# Patient Record
Sex: Male | Born: 1955 | Race: White | Hispanic: No | Marital: Married | Smoking: Former smoker
Health system: Southern US, Community
[De-identification: ages and names within clinical notes are randomized; demographics above are authoritative.]

## PROBLEM LIST (undated history)

## (undated) DIAGNOSIS — G4730 Sleep apnea, unspecified: Secondary | ICD-10-CM

## (undated) DIAGNOSIS — I10 Essential (primary) hypertension: Secondary | ICD-10-CM

## (undated) HISTORY — PX: OTHER SURGICAL HISTORY: SHX169

## (undated) HISTORY — DX: Sleep apnea, unspecified: G47.30

## (undated) HISTORY — PX: APPENDECTOMY: SHX54

## (undated) HISTORY — DX: Essential (primary) hypertension: I10

---

## 1999-05-09 ENCOUNTER — Ambulatory Visit (HOSPITAL_COMMUNITY): Admission: RE | Admit: 1999-05-09 | Discharge: 1999-05-09 | Payer: Self-pay | Admitting: Orthopedic Surgery

## 1999-05-09 ENCOUNTER — Encounter: Payer: Self-pay | Admitting: Orthopedic Surgery

## 1999-06-02 ENCOUNTER — Encounter (INDEPENDENT_AMBULATORY_CARE_PROVIDER_SITE_OTHER): Payer: Self-pay | Admitting: *Deleted

## 1999-06-02 ENCOUNTER — Ambulatory Visit (HOSPITAL_BASED_OUTPATIENT_CLINIC_OR_DEPARTMENT_OTHER): Admission: RE | Admit: 1999-06-02 | Discharge: 1999-06-02 | Payer: Self-pay | Admitting: Orthopedic Surgery

## 2008-10-05 ENCOUNTER — Encounter: Admission: RE | Admit: 2008-10-05 | Discharge: 2008-10-05 | Payer: Self-pay

## 2008-11-03 ENCOUNTER — Encounter: Admission: RE | Admit: 2008-11-03 | Discharge: 2008-11-03 | Payer: Self-pay

## 2009-11-14 IMAGING — CR DG ORBITS FOR FOREIGN BODY
2 series · 2 of 2 positions shown · non-contrast
Comparison: None.

CLINICAL DATA: Screening or old film for metal prior to MRI.

ORBITS FOR FOREIGN BODY - 2 VIEW

[view not recorded (1 of 2)]
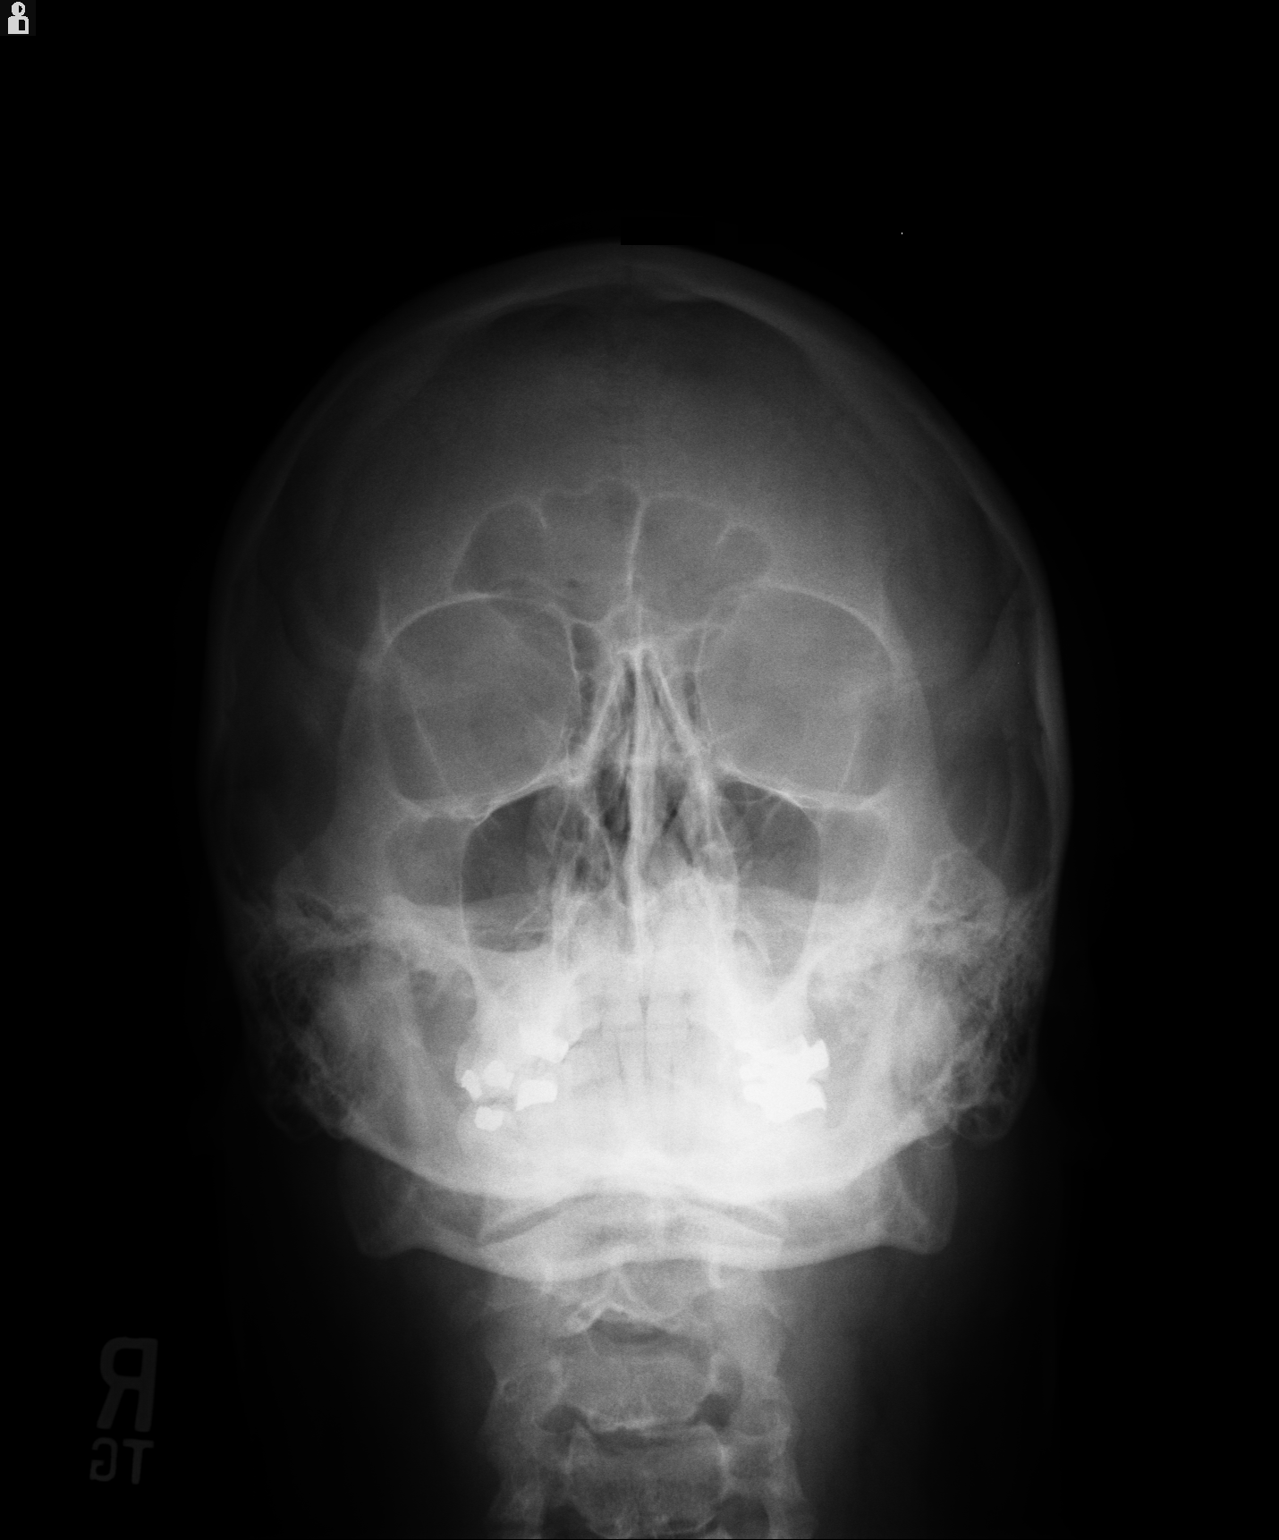

[view not recorded (2 of 2)]
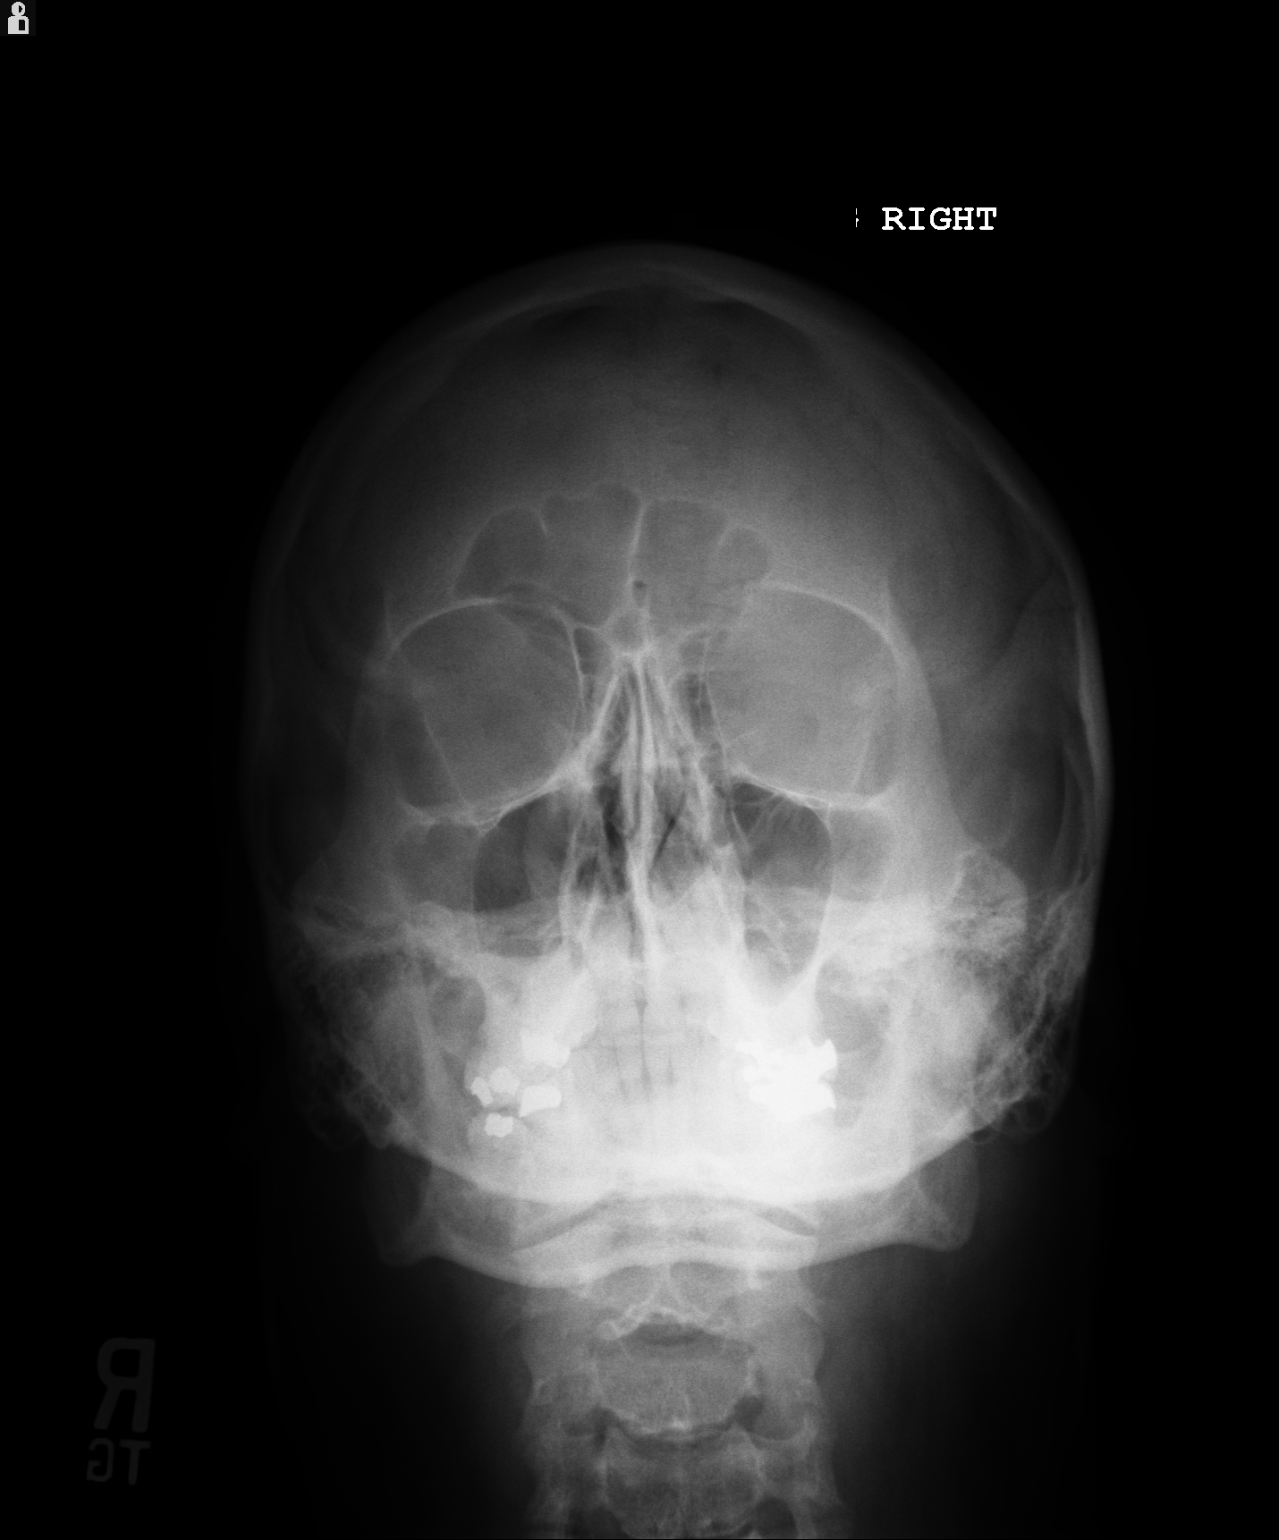

[2 of 2 positions shown; findings below may reference images not displayed]

FINDINGS: No orbital foreign bodies are identified.
IMPRESSION: No metallic orbital foreign bodies.

## 2010-07-29 NOTE — Op Note (Signed)
[place]  Patient:    Gavin Walker, Gavin Walker                         MRN: [id number] Proc. Date: 06/02/99 Adm. Date:  [id number] Attending:  Ronne Binning Dictator:   Nicki Reaper, M.D. CC:         Nicki Reaper, M.D. 2 copies                           Operative Report  PREOPERATIVE DIAGNOSIS:  Tumor, middle phalanx, right index finger.  POSTOPERATIVE DIAGNOSIS:  Tumor, middle phalanx, right index finger.  PROCEDURE:  Excision of mass of right index finger, middle phalanx.  SURGEON:  Dr. Merlyn Lot.  ANESTHESIA:  Axillary block.  ANESTHESIOLOGIST:  Halford Decamp, M.D.  HISTORY OF PRESENT ILLNESS:  The patient is a 55 year old male with a history of a mass enlarging on the middle phalanx at the right index finger.  This has been present for approximately 20 years.  X-rays reveal a bony tumor.  DESCRIPTION OF PROCEDURE:  The patient was brought to the operating room where n axillary block was carried out without difficulty.  Was prepped and draped using Betadine scrubbing solution.  With the right arm free the limb was exsanguinated with a Esmarch bandage.  Tourniquet was placed on the forearm and inflated to 200 mmHg.  A mediolateral incision was made carried down through the subcutaneous tissue.  Mass was immediately encountered.  This was a hard bony tumor with well encapsulation.  The neurovascular structures volarly were protected.  The dorsal sensory nerve was identified, found to be stretched over the tumor.  This was dissected free with blunt and sharp dissection protected.  Osteotomes were then  used to develop an interval between the tumor and normal bone.  This was then osteotomized, and with a moderate amount of difficulty removed due to the hard ony nature of the lesion.  It was removed in total.  X-rays in the AP and lateral oblique direction revealed complete excision of the mass.  The wound was copiously irrigated with  saline.  The skin was closed with interrupted 5-0 Nylon suture. It should be noted that a portion of the A pulley 4 was involved with the mass. This was not able to be reattached with the mass having proceeded to the volar surface. The wound was again irrigated.  A sterile compressive dressing and splint was applied.  The patient tolerated the procedure well, and was taken to the recovery room for observation in satisfactory condition.  He is discharged home.  DISPOSITION:  [facility] in one week.  DISCHARGE MEDICATIONS: 1. Vicodin. 2. Septra DS. DD:  06/02/99 TD:  06/02/99 Job: [id number]

## 2015-06-14 ENCOUNTER — Ambulatory Visit (INDEPENDENT_AMBULATORY_CARE_PROVIDER_SITE_OTHER): Payer: 59 | Admitting: Family Medicine

## 2015-06-14 VITALS — BP 166/98 | HR 81 | Temp 98.0°F | Resp 18 | Ht 70.5 in | Wt 264.0 lb

## 2015-06-14 DIAGNOSIS — H6982 Other specified disorders of Eustachian tube, left ear: Secondary | ICD-10-CM | POA: Diagnosis not present

## 2015-06-14 DIAGNOSIS — R030 Elevated blood-pressure reading, without diagnosis of hypertension: Secondary | ICD-10-CM | POA: Diagnosis not present

## 2015-06-14 DIAGNOSIS — E669 Obesity, unspecified: Secondary | ICD-10-CM

## 2015-06-14 DIAGNOSIS — IMO0001 Reserved for inherently not codable concepts without codable children: Secondary | ICD-10-CM

## 2015-06-14 LAB — GLUCOSE, POCT (MANUAL RESULT ENTRY): POC GLUCOSE: 107 mg/dL — AB (ref 70–99)

## 2015-06-14 MED ORDER — PREDNISONE 20 MG PO TABS
20.0000 mg | ORAL_TABLET | Freq: Every day | ORAL | Status: DC
Start: 2015-06-14 — End: 2017-08-17

## 2015-06-14 MED ORDER — FLUTICASONE PROPIONATE 50 MCG/ACT NA SUSP: NASAL | Status: DC

## 2015-06-14 NOTE — Progress Notes (Signed)
Patient ID: Gavin RosenthalStephen N Walker, male    DOB: 1955/12/09  Age: 60 y.o. MRN: [id number]  Chief Complaint  Patient presents with  . Otalgia    left ear ache x2 weeks; denies having ringing/discharge    Subjective:   Patient is here for the first time in a long time. He is on CPAP for his sleep apnea. He irrigates his nose regularly at nighttime. He blew hard and the left ear stopped up. That is been several weeks ago and it continues to be plugged up and some ringing in it. Has not had that before. He has been working on losing weight, has recently lost about 20 pounds. Does not recall being told he was hypotensive ever. He also has never been told he was diabetic.  Current allergies, medications, problem list, past/family and social histories reviewed.  Objective:  BP 166/98 mmHg  Pulse 81  Temp(Src) 98 F (36.7 C) (Oral)  Resp 18  Ht 5' 10.5" (1.791 m)  Wt 264 lb (119.75 kg)  BMI 37.33 kg/m2  SpO2 98%  Blood pressure elevation is noted. Repeat was 174/86. TMs normal right. Left almost looks like he has a film over it, it is a krinkled appearance to it. Pearly white, noninfected. Throat is clear. Obesity is noted.  Assessment & Plan:   Assessment: 1. Eustachian tube dysfunction, left   2. Elevated blood pressure   3. Obesity       Plan: Will check blood sugar before putting months prednisone. Because of elevated blood pressure typically chemistries also.  Orders Placed This Encounter  Procedures  . Basic metabolic panel  . POCT glucose (manual entry)    Meds ordered this encounter  Medications  . predniSONE (DELTASONE) 20 MG tablet    Sig: Take 1 tablet (20 mg total) by mouth daily with breakfast.    Dispense:  12 tablet    Refill:  0  . fluticasone (FLONASE) 50 MCG/ACT nasal spray    Sig: Use 2 sprays each nostril twice daily for 4 days, then decrease to once daily    Dispense:  16 g    Refill:  1    Results for orders placed or performed in visit on 06/14/15  POCT  glucose (manual entry)  Result Value Ref Range   POC Glucose 107 (A) 70 - 99 mg/dl        Patient Instructions   Monitor your blood pressure to make sure it is running below 140/90. If it continues to be elevated please return  Take the prednisone 20 mg 3 pills daily for 2 days, then 2 daily for 2 days, then 1 daily for 2 days for the eustachian tubes  Use the fluticasone nose spray 2 sprays each nostril twice daily for 4 days, then once daily  If symptoms persist please return for a recheck.  I will let you know the results of your remaining labs, but you sugar was excellent.  IF you received an x-ray today, you will receive an invoice from [facility]. Please contact [facility] at [phone] with questions or concerns regarding your invoice.   IF you received labwork today, you will receive an invoice from [facility]. Please contact [facility] at [phone] with questions or concerns regarding your invoice.   Our billing staff will not be able to assist you with questions regarding bills from these companies.  You will be contacted with the lab results as soon as they are available. The fastest way to get  your results is to activate your My Chart account. Instructions are located on the last page of this paperwork. If you have not heard from [place] regarding the results in 2 weeks, please contact this office.          No Follow-up on file.   Anisten Tomassi, MD 06/14/2015

## 2015-06-14 NOTE — Patient Instructions (Addendum)
Monitor your blood pressure to make sure it is running below 140/90. If it continues to be elevated please return  Take the prednisone 20 mg 3 pills daily for 2 days, then 2 daily for 2 days, then 1 daily for 2 days for the eustachian tubes  Use the fluticasone nose spray 2 sprays each nostril twice daily for 4 days, then once daily  If symptoms persist please return for a recheck.  I will let you know the results of your remaining labs, but you sugar was excellent.  IF you received an x-ray today, you will receive an invoice from [facility]. Please contact [facility] at [phone] with questions or concerns regarding your invoice.   IF you received labwork today, you will receive an invoice from [facility]. Please contact [facility] at [phone] with questions or concerns regarding your invoice.   Our billing staff will not be able to assist you with questions regarding bills from these companies.  You will be contacted with the lab results as soon as they are available. The fastest way to get your results is to activate your My Chart account. Instructions are located on the last page of this paperwork. If you have not heard from us regarding the results in 2 weeks, please contact this office.

## 2015-06-15 LAB — BASIC METABOLIC PANEL
BUN: 15 mg/dL (ref 7–25)
CALCIUM: 9.7 mg/dL (ref 8.6–10.3)
CO2: 29 mmol/L (ref 20–31)
CREATININE: 0.95 mg/dL (ref 0.70–1.33)
Chloride: 104 mmol/L (ref 98–110)
Glucose, Bld: 94 mg/dL (ref 65–99)
POTASSIUM: 5 mmol/L (ref 3.5–5.3)
Sodium: 142 mmol/L (ref 135–146)

## 2016-10-22 DIAGNOSIS — Z23 Encounter for immunization: Secondary | ICD-10-CM | POA: Diagnosis not present

## 2017-08-16 ENCOUNTER — Emergency Department (HOSPITAL_COMMUNITY)
Admission: EM | Admit: 2017-08-16 | Discharge: 2017-08-16 | Disposition: A | Discharge status: Home / Self Care | Payer: 59 | Attending: Emergency Medicine | Admitting: Emergency Medicine

## 2017-08-16 ENCOUNTER — Encounter (HOSPITAL_COMMUNITY): Payer: Self-pay | Admitting: Emergency Medicine

## 2017-08-16 DIAGNOSIS — Z79899 Other long term (current) drug therapy: Secondary | ICD-10-CM | POA: Insufficient documentation

## 2017-08-16 DIAGNOSIS — Z87891 Personal history of nicotine dependence: Secondary | ICD-10-CM | POA: Diagnosis not present

## 2017-08-16 DIAGNOSIS — Z0131 Encounter for examination of blood pressure with abnormal findings: Secondary | ICD-10-CM | POA: Diagnosis not present

## 2017-08-16 DIAGNOSIS — I10 Essential (primary) hypertension: Secondary | ICD-10-CM | POA: Diagnosis present

## 2017-08-16 LAB — CBC WITH DIFFERENTIAL/PLATELET
Basophils Absolute: 0.1 10*3/uL (ref 0.0–0.1)
Basophils Relative: 1 %
EOS PCT: 1 %
Eosinophils Absolute: 0.1 10*3/uL (ref 0.0–0.7)
HEMATOCRIT: 39.4 % (ref 39.0–52.0)
Hemoglobin: 12.9 g/dL — ABNORMAL LOW (ref 13.0–17.0)
Lymphocytes Relative: 27 %
Lymphs Abs: 2.2 10*3/uL (ref 0.7–4.0)
MCH: 20.7 pg — ABNORMAL LOW (ref 26.0–34.0)
MCHC: 32.7 g/dL (ref 30.0–36.0)
MCV: 63.2 fL — AB (ref 78.0–100.0)
MONOS PCT: 8 %
Monocytes Absolute: 0.6 10*3/uL (ref 0.1–1.0)
NEUTROS PCT: 63 %
Neutro Abs: 5.1 10*3/uL (ref 1.7–7.7)
Platelets: 228 10*3/uL (ref 150–400)
RBC: 6.23 MIL/uL — ABNORMAL HIGH (ref 4.22–5.81)
RDW: 17.2 % — ABNORMAL HIGH (ref 11.5–15.5)
WBC: 8.1 10*3/uL (ref 4.0–10.5)

## 2017-08-16 LAB — URINALYSIS, ROUTINE W REFLEX MICROSCOPIC
BILIRUBIN URINE: NEGATIVE
Bacteria, UA: NONE SEEN
GLUCOSE, UA: NEGATIVE mg/dL
Hgb urine dipstick: NEGATIVE
KETONES UR: NEGATIVE mg/dL
LEUKOCYTES UA: NEGATIVE
NITRITE: NEGATIVE
PROTEIN: 100 mg/dL — AB
Specific Gravity, Urine: 1.021 (ref 1.005–1.030)
pH: 6 (ref 5.0–8.0)

## 2017-08-16 LAB — BASIC METABOLIC PANEL
ANION GAP: 7 (ref 5–15)
BUN: 12 mg/dL (ref 6–20)
CO2: 27 mmol/L (ref 22–32)
Calcium: 9.3 mg/dL (ref 8.9–10.3)
Chloride: 106 mmol/L (ref 101–111)
Creatinine, Ser: 0.84 mg/dL (ref 0.61–1.24)
GFR calc Af Amer: >60 mL/min (ref >60)
GLUCOSE: 104 mg/dL — AB (ref 65–99)
POTASSIUM: 4 mmol/L (ref 3.5–5.1)
Sodium: 140 mmol/L (ref 135–145)

## 2017-08-16 NOTE — ED Provider Notes (Signed)
[facility]-EMERGENCY DEPT Provider Note   CSN: [id number] Arrival date & time: 08/16/17  1510     History   Chief Complaint Chief Complaint  Patient presents with  . Hypertension    HPI Gavin Walker is a 62 y.o. male who presents to ED for evaluation of high blood pressure reading.  He was at his dentist office earlier this morning for a routine checkup.  He was told that he had high blood pressure.  He then went to a walk-in clinic at his PCPs office several hours later and was told that he still had high blood pressure with systolics in the 190s.  He was then advised to come to the emergency room immediately.  He has not seen a primary care provider in several years.  States that when he got his blood pressure checked several years ago it was 140s systolic.  He was never started on any antihypertensive medications.  He denies any chest pain, shortness of breath, headache, vision changes, numbness in arms or legs, injuries or falls, history of MI, CVA.  He denies any pain.  HPI  Past Medical History:  Diagnosis Date  . Sleep apnea     There are no active problems to display for this patient.   Past Surgical History:  Procedure Laterality Date  . APPENDECTOMY          Home Medications    Prior to Admission medications   Medication Sig Start Date End Date Taking? Authorizing Provider  B Complex Vitamins (VITAMIN B COMPLEX PO) Take 1 tablet by mouth daily.   Yes [provider]  Cholecalciferol (VITAMIN D3) 2000 units TABS Take 1 tablet by mouth daily.   Yes [provider]  Coenzyme Q10 (CO Q-10 PO) Take 1 tablet by mouth daily.   Yes [provider]  IRON PO Take 1 tablet by mouth daily.   Yes [provider]  Omega-3 Fatty Acids (FISH OIL) 1000 MG CAPS Take 1 capsule by mouth daily.   Yes [provider]  TURMERIC PO Take 1 capsule by mouth daily.   Yes [provider]  fluticasone (FLONASE) 50  MCG/ACT nasal spray Use 2 sprays each nostril twice daily for 4 days, then decrease to once daily Patient not taking: Reported on 08/16/2017 06/14/15   Peyton Najjar, MD  predniSONE (DELTASONE) 20 MG tablet Take 1 tablet (20 mg total) by mouth daily with breakfast. Patient not taking: Reported on 08/16/2017 06/14/15   Peyton Najjar, MD    Family History No family history on file.  Social History Social History   Tobacco Use  . Smoking status: Former Games developer  . Smokeless tobacco: Never Used  Substance Use Topics  . Alcohol use: Yes  . Drug use: No     Allergies   Penicillins and Shellfish allergy   Review of Systems Review of Systems  Constitutional: Negative for appetite change, chills and fever.  HENT: Negative for ear pain, rhinorrhea, sneezing and sore throat.   Eyes: Negative for photophobia and visual disturbance.  Respiratory: Negative for cough, chest tightness, shortness of breath and wheezing.   Cardiovascular: Negative for chest pain and palpitations.  Gastrointestinal: Negative for abdominal pain, blood in stool, constipation, diarrhea, nausea and vomiting.  Genitourinary: Negative for dysuria, hematuria and urgency.  Musculoskeletal: Negative for myalgias.  Skin: Negative for rash.  Neurological: Negative for dizziness, weakness and light-headedness.     Physical Exam Updated Vital Signs BP (!) 184/85  Pulse 63   Temp 98.4 F (36.9 C) (Oral)   Resp 18   Ht 5\' 10"  (1.778 m)   Wt 108.9 kg (240 lb)   SpO2 93%   BMI 34.44 kg/m   Physical Exam  Constitutional: He is oriented to person, place, and time. He appears well-developed and well-nourished. No distress.  HENT:  Head: Normocephalic and atraumatic.  Nose: Nose normal.  Eyes: Pupils are equal, round, and reactive to light. Conjunctivae and EOM are normal. Right eye exhibits no discharge. Left eye exhibits no discharge. No scleral icterus.  Neck: Normal range of motion. Neck supple.  Cardiovascular:  Normal rate, regular rhythm, normal heart sounds and intact distal pulses. Exam reveals no gallop and no friction rub.  No murmur heard. Pulmonary/Chest: Effort normal and breath sounds normal. No respiratory distress.  Abdominal: Soft. Bowel sounds are normal. He exhibits no distension. There is no tenderness. There is no guarding.  Musculoskeletal: Normal range of motion. He exhibits no edema.  Neurological: He is alert and oriented to person, place, and time. No cranial nerve deficit or sensory deficit. He exhibits normal muscle tone. Coordination normal.  Pupils reactive. No facial asymmetry noted. Cranial nerves appear grossly intact. Sensation intact to light touch on face, BUE and BLE. Strength 5/5 in BUE and BLE.   Skin: Skin is warm and dry. No rash noted.  Psychiatric: He has a normal mood and affect.  Nursing note and vitals reviewed.    ED Treatments / Results  Labs (all labs ordered are listed, but only abnormal results are displayed) Labs Reviewed  CBC WITH DIFFERENTIAL/PLATELET - Abnormal; Notable for the following components:      Result Value   RBC 6.23 (*)    Hemoglobin 12.9 (*)    MCV 63.2 (*)    MCH 20.7 (*)    RDW 17.2 (*)    All other components within normal limits  BASIC METABOLIC PANEL - Abnormal; Notable for the following components:   Glucose, Bld 104 (*)    All other components within normal limits  URINALYSIS, ROUTINE W REFLEX MICROSCOPIC - Abnormal; Notable for the following components:   APPearance HAZY (*)    Protein, ur 100 (*)    All other components within normal limits    EKG None  Radiology No results found.  Procedures Procedures (including critical care time)  Medications Ordered in ED Medications - No data to display   Initial Impression / Assessment and Plan / ED Course  I have reviewed the triage vital signs and the nursing notes.  Pertinent labs & imaging results that were available during my care of the patient were  reviewed by me and considered in my medical decision making (see chart for details).     Patient's blood pressure elevated in the emergency department today. Patient denies headache, change in vision, numbness, weakness, chest pain, dyspnea, dizziness, or lightheadedness therefore doubt hypertensive emergency. Screening labs including CBC, BMP, UA unremarkable. Discussed elevated blood pressure with the patient and the need for primary care follow up with potential need to initiate or change antihypertensive medications and or for further evaluation.  Patient is agreeable to plan and is comfortable with discharge home. Discussed return precaution signs/symptoms for hypertensive emergency as listed above with the patient.   Portions of this note were generated with Scientist, clinical (histocompatibility and immunogenetics)Dragon dictation software. Dictation errors may occur despite best attempts at proofreading.   Final Clinical Impressions(s) / ED Diagnoses   Final diagnoses:  Hypertension, unspecified type  ED Discharge Orders    None       Dietrich Pates, PA-C 08/16/17 1951    Jacalyn Lefevre, MD 08/16/17 2002

## 2017-08-16 NOTE — ED Triage Notes (Signed)
Pt reports being at dentist this am and was told has high BP. Pt went to PCP clinic couple hours later and BP was about 190/100something again and was advised to go to ED right away. No HTN hx. Denies n/v, headache or dizziness.

## 2017-08-16 NOTE — Discharge Instructions (Signed)
Please follow-up at the wellness center listed below for further evaluation of your high blood pressure.

## 2017-08-17 ENCOUNTER — Ambulatory Visit (INDEPENDENT_AMBULATORY_CARE_PROVIDER_SITE_OTHER): Payer: 59 | Admitting: Physician Assistant

## 2017-08-17 ENCOUNTER — Encounter: Payer: Self-pay | Admitting: Physician Assistant

## 2017-08-17 ENCOUNTER — Other Ambulatory Visit: Payer: Self-pay

## 2017-08-17 VITALS — BP 184/102 | HR 71 | Temp 99.6°F | Resp 16 | Ht 69.0 in | Wt 238.0 lb

## 2017-08-17 DIAGNOSIS — I10 Essential (primary) hypertension: Secondary | ICD-10-CM

## 2017-08-17 LAB — HEMOGLOBIN A1C
ESTIMATED AVERAGE GLUCOSE: 94 mg/dL
HEMOGLOBIN A1C: 4.9 % (ref 4.8–5.6)

## 2017-08-17 LAB — LIPID PANEL
CHOL/HDL RATIO: 2.5 ratio (ref 0.0–5.0)
CHOLESTEROL TOTAL: 211 mg/dL — AB (ref 100–199)
HDL: 86 mg/dL (ref >39)
LDL Calculated: 104 mg/dL — ABNORMAL HIGH (ref 0–99)
TRIGLYCERIDES: 107 mg/dL (ref 0–149)
VLDL Cholesterol Cal: 21 mg/dL (ref 5–40)

## 2017-08-17 MED ORDER — AMLODIPINE BESYLATE 5 MG PO TABS
5.0000 mg | ORAL_TABLET | Freq: Every day | ORAL | 3 refills | Status: DC
Start: 2017-08-17 — End: 2018-08-12

## 2017-08-17 MED ORDER — CHLORTHALIDONE 25 MG PO TABS: 12.5000 mg | ORAL_TABLET | Freq: Every day | ORAL | 3 refills | Status: DC

## 2017-08-17 NOTE — Progress Notes (Signed)
08/17/2017 11:39 AM   DOB: 02-Jun-1955 / MRN: [id number]  SUBJECTIVE:  Gavin Walker is a 62 y.o. male presenting for elevated BP.  Found at dentist and went to UC and was sent to the ED who advised him to come here after lab work and eval.  Takes ASA 325 4 times a week for primary prevention. Quit smoking 2002 and smoked for about 12 years at 1 pack back.   He is allergic to penicillins and shellfish allergy.   He  has a past medical history of Sleep apnea.    He  reports that he has quit smoking. He quit after 15.00 years of use. He has never used smokeless tobacco. He reports that he drinks about 7.2 oz of alcohol per week. He reports that he does not use drugs. He  has no sexual activity history on file. The patient  has a past surgical history that includes Appendectomy and arm fracture.  His family history is not on file.  Review of Systems  Constitutional: Negative for chills, diaphoresis and fever.  Eyes: Negative.  Negative for blurred vision, double vision and pain.  Respiratory: Negative for cough, hemoptysis, sputum production, shortness of breath and wheezing.   Cardiovascular: Negative for chest pain, orthopnea and leg swelling.  Gastrointestinal: Negative for abdominal pain, blood in stool, constipation, diarrhea, heartburn, melena, nausea and vomiting.  Genitourinary: Negative for dysuria, flank pain, frequency, hematuria and urgency.  Skin: Negative for rash.  Neurological: Negative for dizziness, sensory change, speech change, focal weakness and headaches.    The problem list and medications were reviewed and updated by myself where necessary and exist elsewhere in the encounter.   OBJECTIVE:  BP (!) 184/102   Pulse 71   Temp 99.6 F (37.6 C) (Oral)   Resp 16   Ht 5' 9"  (1.753 m)   Wt 238 lb (108 kg)   SpO2 98%   BMI 35.15 kg/m   BP Readings from Last 3 Encounters:  08/17/17 (!) 184/102  08/16/17 (!) 185/96  06/14/15 (!) 166/98     Physical Exam    Constitutional: He is oriented to person, place, and time. He appears well-developed. He does not appear ill.  Eyes: Pupils are equal, round, and reactive to light. Conjunctivae and EOM are normal.  Cardiovascular: Normal rate, regular rhythm, S1 normal, S2 normal, normal heart sounds, intact distal pulses and normal pulses. Exam reveals no gallop and no friction rub.  No murmur heard. Pulmonary/Chest: Effort normal. No stridor. No respiratory distress. He has no wheezes. He has no rales.  Abdominal: He exhibits no distension.  Musculoskeletal: Normal range of motion. He exhibits no edema.  Neurological: He is alert and oriented to person, place, and time. He has normal strength and normal reflexes. He is not disoriented. No cranial nerve deficit or sensory deficit. He exhibits normal muscle tone. Coordination and gait normal.  Skin: Skin is warm and dry. He is not diaphoretic.  Psychiatric: He has a normal mood and affect. His behavior is normal.  Nursing note and vitals reviewed.   Results for orders placed or performed during the hospital encounter of 08/16/17 (from the past 72 hour(s))  CBC with Differential     Status: Abnormal   Collection Time: 08/16/17  7:03 PM  Result Value Ref Range   WBC 8.1 4.0 - 10.5 K/uL   RBC 6.23 (H) 4.22 - 5.81 MIL/uL   Hemoglobin 12.9 (L) 13.0 - 17.0 g/dL   HCT 39.4 39.0 -  52.0 %   MCV 63.2 (L) 78.0 - 100.0 fL   MCH 20.7 (L) 26.0 - 34.0 pg   MCHC 32.7 30.0 - 36.0 g/dL   RDW 17.2 (H) 11.5 - 15.5 %   Platelets 228 150 - 400 K/uL   Neutrophils Relative % 63 %   Lymphocytes Relative 27 %   Monocytes Relative 8 %   Eosinophils Relative 1 %   Basophils Relative 1 %   Neutro Abs 5.1 1.7 - 7.7 K/uL   Lymphs Abs 2.2 0.7 - 4.0 K/uL   Monocytes Absolute 0.6 0.1 - 1.0 K/uL   Eosinophils Absolute 0.1 0.0 - 0.7 K/uL   Basophils Absolute 0.1 0.0 - 0.1 K/uL   Smear Review MORPHOLOGY UNREMARKABLE     Comment: Performed at [facility], [place] [address] [place], [place] [address]  Basic metabolic panel     Status: Abnormal   Collection Time: 08/16/17  7:03 PM  Result Value Ref Range   Sodium 140 135 - 145 mmol/L   Potassium 4.0 3.5 - 5.1 mmol/L   Chloride 106 101 - 111 mmol/L   CO2 27 22 - 32 mmol/L   Glucose, Bld 104 (H) 65 - 99 mg/dL   BUN 12 6 - 20 mg/dL   Creatinine, Ser 0.84 0.61 - 1.24 mg/dL   Calcium 9.3 8.9 - 10.3 mg/dL   GFR calc non Af Amer >60 >60 mL/min   GFR calc Af Amer >60 >60 mL/min    Comment: (NOTE) The eGFR has been calculated using the CKD EPI equation. This calculation has not been validated in all clinical situations. eGFR's persistently <60 mL/min signify possible Chronic Kidney Disease.    Anion gap 7 5 - 15    Comment: Performed at [facility], [place] [address], [place], [place] [address]  Urinalysis, Routine w reflex microscopic     Status: Abnormal   Collection Time: 08/16/17  7:03 PM  Result Value Ref Range   Color, Urine YELLOW YELLOW   APPearance HAZY (A) CLEAR   Specific Gravity, Urine 1.021 1.005 - 1.030   pH 6.0 5.0 - 8.0   Glucose, UA NEGATIVE NEGATIVE mg/dL   Hgb urine dipstick NEGATIVE NEGATIVE   Bilirubin Urine NEGATIVE NEGATIVE   Ketones, ur NEGATIVE NEGATIVE mg/dL   Protein, ur 100 (A) NEGATIVE mg/dL   Nitrite NEGATIVE NEGATIVE   Leukocytes, UA NEGATIVE NEGATIVE   RBC / HPF 0-5 0 - 5 RBC/hpf   WBC, UA 0-5 0 - 5 WBC/hpf   Bacteria, UA NONE SEEN NONE SEEN   Mucus PRESENT     Comment: Performed at [facility], [place] [address], [place], [place] [address]    No results found.  ASSESSMENT AND PLAN:  Gavin Walker was seen today for hypertension.  Diagnoses and all orders for this visit:  Asymptomatic hypertension -     EKG 12-Lead -     Hemoglobin A1c -     Lipid Panel -     chlorthalidone (HYGROTON) 25 MG tablet; Take 0.5 tablets (12.5 mg total) by mouth daily. -     amLODipine (NORVASC) 5 MG tablet; Take 1 tablet (5 mg total) by  mouth daily.    The patient is advised to call or return to clinic if he does not see an improvement in symptoms, or to seek the care of the closest emergency department if he worsens with the above plan.   Philis Fendt, MHS, PA-C Primary Care at [facility]  [facility] 08/17/2017 11:39 AM

## 2017-08-17 NOTE — Patient Instructions (Addendum)
Start meds today.     IF you received an x-ray today, you will receive an invoice from [facility]. Please contact [facility] at [phone] with questions or concerns regarding your invoice.   IF you received labwork today, you will receive an invoice from [facility]. Please contact [facility] at [phone] with questions or concerns regarding your invoice.   Our billing staff will not be able to assist you with questions regarding bills from these companies.  You will be contacted with the lab results as soon as they are available. The fastest way to get your results is to activate your My Chart account. Instructions are located on the last page of this paperwork. If you have not heard from us regarding the results in 2 weeks, please contact this office.

## 2017-08-20 ENCOUNTER — Encounter: Payer: Self-pay | Admitting: *Deleted

## 2017-09-14 ENCOUNTER — Ambulatory Visit (INDEPENDENT_AMBULATORY_CARE_PROVIDER_SITE_OTHER): Payer: 59 | Admitting: Physician Assistant

## 2017-09-14 ENCOUNTER — Encounter: Payer: Self-pay | Admitting: Physician Assistant

## 2017-09-14 VITALS — BP 161/78 | HR 71 | Temp 98.0°F | Resp 17 | Ht 70.5 in | Wt 252.0 lb

## 2017-09-14 DIAGNOSIS — E7800 Pure hypercholesterolemia, unspecified: Secondary | ICD-10-CM | POA: Diagnosis not present

## 2017-09-14 DIAGNOSIS — E669 Obesity, unspecified: Secondary | ICD-10-CM

## 2017-09-14 DIAGNOSIS — I10 Essential (primary) hypertension: Secondary | ICD-10-CM | POA: Diagnosis not present

## 2017-09-14 MED ORDER — CHLORTHALIDONE 25 MG PO TABS: 25.0000 mg | ORAL_TABLET | Freq: Every day | ORAL | 3 refills | Status: DC

## 2017-09-14 NOTE — Progress Notes (Signed)
09/14/2017 8:35 AM   DOB: [birth year] / MRN: [id number]  SUBJECTIVE:  Gavin Walker Chasen is a 62 y.o. male presenting for follow up of HTN. My last HPI on 6/7 as follows : "Gavin Walker Bence is a 62 y.o. male presenting for elevated BP.  Found at dentist and went to UC and was sent to the ED who advised him to come here after lab work and eval.  Takes ASA 325 4 times a week for primary prevention. Quit smoking 2002 and smoked for about 12 years at 1 pack back.  Today he tells me that he feels well.  Denies any complaints.   He is allergic to penicillins and shellfish allergy.   He  has a past medical history of Sleep apnea.    He  reports that he has quit smoking. He quit after 15.00 years of use. He has never used smokeless tobacco. He reports that he drinks about 7.2 oz of alcohol per week. He reports that he does not use drugs. He  has no sexual activity history on file. The patient  has a past surgical history that includes Appendectomy and arm fracture.  His family history is not on file.  Review of Systems  Constitutional: Negative for chills, diaphoresis and fever.  Eyes: Negative.  Negative for blurred vision, double vision and pain.  Respiratory: Negative for cough, hemoptysis, sputum production, shortness of breath and wheezing.   Cardiovascular: Negative for chest pain, orthopnea and leg swelling.  Gastrointestinal: Negative for abdominal pain, blood in stool, constipation, diarrhea, heartburn, melena, nausea and vomiting.  Genitourinary: Negative for dysuria, flank pain, frequency, hematuria and urgency.  Skin: Negative for rash.  Neurological: Negative for dizziness, sensory change, speech change, focal weakness and headaches.    The problem list and medications were reviewed and updated by myself where necessary and exist elsewhere in the encounter.   OBJECTIVE:  BP (!) 161/78   Pulse 71   Temp 98 F (36.7 C) (Oral)   Resp 17   Ht 5' 10.5" (1.791 m)   Wt 252 lb (114.3  kg)   SpO2 98%   BMI 35.65 kg/m   Wt Readings from Last 3 Encounters:  09/14/17 252 lb (114.3 kg)  08/17/17 238 lb (108 kg)  08/16/17 240 lb (108.9 kg)   Temp Readings from Last 3 Encounters:  09/14/17 98 F (36.7 C) (Oral)  08/17/17 99.6 F (37.6 C) (Oral)  08/16/17 98.4 F (36.9 C) (Oral)   BP Readings from Last 3 Encounters:  09/14/17 (!) 161/78  08/17/17 (!) 184/102  08/16/17 (!) 185/96   Pulse Readings from Last 3 Encounters:  09/14/17 71  08/17/17 71  08/16/17 63    Physical Exam  Constitutional: He is oriented to person, place, and time. He appears well-developed. He does not appear ill.  Eyes: Pupils are equal, round, and reactive to light. Conjunctivae and EOM are normal.  Cardiovascular: Normal rate.  Pulmonary/Chest: Effort normal and breath sounds normal.  Abdominal: Soft. Bowel sounds are normal. He exhibits no distension.  Musculoskeletal: Normal range of motion.  Neurological: He is alert and oriented to person, place, and time. No cranial nerve deficit. Coordination normal.  Skin: Skin is warm and dry. He is not diaphoretic.  Psychiatric: He has a normal mood and affect.  Nursing note and vitals reviewed.   Lab Results  Component Value Date   HGBA1C 4.9 08/17/2017    Lab Results  Component Value Date   WBC 8.1 08/16/2017  HGB 12.9 (L) 08/16/2017   HCT 39.4 08/16/2017   MCV 63.2 (L) 08/16/2017   PLT 228 08/16/2017    Lab Results  Component Value Date   CREATININE 0.84 08/16/2017   BUN 12 08/16/2017   NA 140 08/16/2017   K 4.0 08/16/2017   CL 106 08/16/2017   CO2 27 08/16/2017   Lab Results  Component Value Date   CHOL 211 (H) 08/17/2017   HDL 86 08/17/2017   LDLCALC 104 (H) 08/17/2017   TRIG 107 08/17/2017   CHOLHDL 2.5 08/17/2017   The 10-year ASCVD risk score Denman George DC Jr., et al., 2013) is: 10.6%   Values used to calculate the score:     Age: 34 years     Sex: Male     Is Non-Hispanic African American: No     Diabetic:  No     Tobacco smoker: No     Systolic Blood Pressure: 161 mmHg     Is BP treated: Yes     HDL Cholesterol: 86 mg/dL     Total Cholesterol: 211 mg/dL   ASSESSMENT AND PLAN:  Todd was seen today for follow-up.  Diagnoses and all orders for this visit:  Asymptomatic hypertension Comments: Increase chlrothalidone to 25.  See AVS.  Three months return, sooner if greater than 140/90 amb.  Orders: -     Renal Function Panel -     chlorthalidone (HYGROTON) 25 MG tablet; Take 1 tablet (25 mg total) by mouth daily.  Elevated LDL cholesterol level Comments: Will hold on statin per patient's request.  This is resonable given favorable HDL.   Obesity, Class II, BMI 35-39.9 Comments: Encourage weight loss.    The patient is advised to call or return to clinic if he does not see an improvement in symptoms, or to seek the care of the closest emergency department if he worsens with the above plan.   Deliah Boston, MHS, PA-C Primary Care at [facility] 09/14/2017 8:35 AM

## 2017-09-14 NOTE — Patient Instructions (Addendum)
Take 1 whole tab of chlorthalidone.  Continue to monitor.  Hopefully pressures will stabilize at less than 140/90.  If they don't come back in about 1 month.  Otherwise, come back in three months.   On another note I will be leaving the practice in the end of August 2019 and will be moving back to the [place] area.  It was a pleasure to get to know you and serve you medically.  Please continue to take your medications.  I am happy to help you transition to another provider in the practice.     IF you received an x-ray today, you will receive an invoice from [facility]. Please contact [facility] at [phone] with questions or concerns regarding your invoice.   IF you received labwork today, you will receive an invoice from [facility]. Please contact [facility] at [phone] with questions or concerns regarding your invoice.   Our billing staff will not be able to assist you with questions regarding bills from these companies.  You will be contacted with the lab results as soon as they are available. The fastest way to get your results is to activate your My Chart account. Instructions are located on the last page of this paperwork. If you have not heard from us regarding the results in 2 weeks, please contact this office.

## 2017-09-15 LAB — RENAL FUNCTION PANEL
Albumin: 4.2 g/dL (ref 3.6–4.8)
BUN / CREAT RATIO: 19 (ref 10–24)
BUN: 16 mg/dL (ref 8–27)
CHLORIDE: 104 mmol/L (ref 96–106)
CO2: 24 mmol/L (ref 20–29)
CREATININE: 0.84 mg/dL (ref 0.76–1.27)
Calcium: 9.1 mg/dL (ref 8.6–10.2)
GFR calc non Af Amer: 95 mL/min/{1.73_m2} (ref >59)
GFR, EST AFRICAN AMERICAN: 110 mL/min/{1.73_m2} (ref >59)
GLUCOSE: 98 mg/dL (ref 65–99)
POTASSIUM: 3.9 mmol/L (ref 3.5–5.2)
Phosphorus: 3.1 mg/dL (ref 2.5–4.5)
Sodium: 143 mmol/L (ref 134–144)

## 2017-10-10 ENCOUNTER — Encounter: Payer: Self-pay | Admitting: Physician Assistant

## 2017-11-02 ENCOUNTER — Telehealth: Payer: Self-pay | Admitting: Urgent Care

## 2017-12-14 ENCOUNTER — Ambulatory Visit: Payer: 59 | Admitting: Urgent Care

## 2018-01-03 NOTE — Telephone Encounter (Signed)
done

## 2018-07-16 ENCOUNTER — Other Ambulatory Visit: Payer: Self-pay | Admitting: Physician Assistant

## 2018-07-16 DIAGNOSIS — I10 Essential (primary) hypertension: Secondary | ICD-10-CM

## 2018-08-12 ENCOUNTER — Ambulatory Visit (INDEPENDENT_AMBULATORY_CARE_PROVIDER_SITE_OTHER): Payer: 59 | Admitting: Registered Nurse

## 2018-08-12 ENCOUNTER — Encounter: Payer: Self-pay | Admitting: Registered Nurse

## 2018-08-12 ENCOUNTER — Other Ambulatory Visit: Payer: Self-pay

## 2018-08-12 VITALS — BP 138/78 | HR 99 | Temp 98.1°F | Resp 16 | Ht 71.0 in | Wt 242.0 lb

## 2018-08-12 DIAGNOSIS — Z13220 Encounter for screening for lipoid disorders: Secondary | ICD-10-CM

## 2018-08-12 DIAGNOSIS — Z130 Encounter for screening for diseases of the blood and blood-forming organs and certain disorders involving the immune mechanism: Secondary | ICD-10-CM

## 2018-08-12 DIAGNOSIS — Z1159 Encounter for screening for other viral diseases: Secondary | ICD-10-CM | POA: Diagnosis not present

## 2018-08-12 DIAGNOSIS — Z1329 Encounter for screening for other suspected endocrine disorder: Secondary | ICD-10-CM

## 2018-08-12 DIAGNOSIS — Z7689 Persons encountering health services in other specified circumstances: Secondary | ICD-10-CM | POA: Diagnosis not present

## 2018-08-12 DIAGNOSIS — Z13228 Encounter for screening for other metabolic disorders: Secondary | ICD-10-CM

## 2018-08-12 DIAGNOSIS — I10 Essential (primary) hypertension: Secondary | ICD-10-CM

## 2018-08-12 DIAGNOSIS — Z1211 Encounter for screening for malignant neoplasm of colon: Secondary | ICD-10-CM

## 2018-08-12 LAB — LIPID PANEL

## 2018-08-12 MED ORDER — CHLORTHALIDONE 25 MG PO TABS: 25.0000 mg | ORAL_TABLET | Freq: Every day | ORAL | 3 refills | Status: DC

## 2018-08-12 MED ORDER — AMLODIPINE BESYLATE 5 MG PO TABS: 5.0000 mg | ORAL_TABLET | Freq: Every day | ORAL | 3 refills | Status: DC

## 2018-08-12 NOTE — Progress Notes (Signed)
Established Patient Office Visit  Subjective:  Patient ID: Gavin Walker, male    DOB: 1955-08-12  Age: 63 y.o. MRN: [id number]  CC:  Chief Complaint  Patient presents with  . Transitions Of Care    pt need a new pcp to manage HTN  . Hypertension    medication refill are needed    HPI [name] presents to establish care with myself as PCP and to discuss medication refills  He has been taking amlodipine  PO and Chlorthalidone  PO both daily for his HTN. Today his BP is 138/78. We can continue on this regimen  Pt reports that he has been losing weight with increased activity and a keto diet. We discussed the potential harms from a keto diet - but will monitor with lab work as needed. He has lost 14 lbs in the past month. We are hoping to decrease or eliminate his HTN medications if he reaches his goal weight of 200lbs.   He reports a tick bite in November, 2019. He states that he was able to pull the tick off himself and he has not noticed any systemic sxs. Recently, he noted that he has a small, firm bump with pinkish coloration of the skin at the site of the bite. It has been itchy. He suspects that he left mouthparts of the tick in when he pulled it, but requests an exam to be sure.   Past Medical History:  Diagnosis Date  . Hypertension   . Sleep apnea     Past Surgical History:  Procedure Laterality Date  . APPENDECTOMY    . arm fracture     repair right arm/wrist    Family History  Problem Relation Age of Onset  . Breast cancer Mother   . Liver cancer Father     Social History   Socioeconomic History  . Marital status: Married    Spouse name: Not on file  . Number of children: 3  . Years of education: Not on file  . Highest education level: Not on file  Occupational History  . Occupation: Barista of the [employer]  Social Needs  . Financial resource strain: Not hard at all  . Food insecurity:    Worry: Never true    Inability: Never  true  . Transportation needs:    Medical: No    Non-medical: No  Tobacco Use  . Smoking status: Former Smoker    Packs/day: 1.00    Years: 15.00    Pack years: 15.00    Start date: 1982    Last attempt to quit: 08/11/2000    Years since quitting: 18.0  . Smokeless tobacco: Never Used  Substance and Sexual Activity  . Alcohol use: Yes    Comment: EtOH cessation for weight loss  . Drug use: No  . Sexual activity: Not Currently  Lifestyle  . Physical activity:    Days per week: 7 days    Minutes per session: Not on file  . Stress: Not at all  Relationships  . Social connections:    Talks on phone: Twice a week    Gets together: Once a week    Attends religious service: Never    Active member of club or organization: No    Attends meetings of clubs or organizations: Never    Relationship status: Married  . Intimate partner violence:    Fear of current or ex partner: No    Emotionally abused: No  Physically abused: No    Forced sexual activity: No  Other Topics Concern  . Not on file  Social History Narrative  . Not on file    Outpatient Medications Prior to Visit  Medication Sig Dispense Refill  . B Complex Vitamins (VITAMIN B COMPLEX PO) Take 1 tablet by mouth daily.    . Cholecalciferol (VITAMIN D3) 2000 units TABS Take 1 tablet by mouth daily.    . Coenzyme Q10 (CO Q-10 PO) Take 1 tablet by mouth daily.    . IRON PO Take 1 tablet by mouth daily.    . Omega-3 Fatty Acids (FISH OIL) 1000 MG CAPS Take 1 capsule by mouth daily.    . TURMERIC PO Take 1 capsule by mouth daily.    Marland Kitchen. amLODipine (NORVASC) 5 MG tablet Take 1 tablet (5 mg total) by mouth daily. 90 tablet 3  . chlorthalidone (HYGROTON) 25 MG tablet Take 1 tablet (25 mg total) by mouth daily. 90 tablet 3   No facility-administered medications prior to visit.     Allergies  Allergen Reactions  . Penicillins     Has patient had a PCN reaction causing immediate rash, facial/tongue/throat swelling, SOB or  lightheadedness with hypotension: Y Has patient had a PCN reaction causing severe rash involving mucus membranes or skin necrosis: Y Has patient had a PCN reaction that required hospitalization: N Has patient had a PCN reaction occurring within the last 10 years: N If all of the above answers are "NO", then may proceed with Cephalosporin use.   . Shellfish Allergy     ROS Review of Systems  Constitutional: Negative.   HENT: Negative.   Eyes: Negative.   Respiratory: Negative.   Cardiovascular: Negative.   Gastrointestinal: Negative.   Endocrine: Negative.   Genitourinary: Negative.   Musculoskeletal: Negative.   Skin: Negative.        Per hpi  Allergic/Immunologic: Negative.   Neurological: Negative.   Hematological: Negative.   Psychiatric/Behavioral: Negative.       Objective:    Physical Exam  Constitutional: He is oriented to person, place, and time. He appears well-developed and well-nourished.  HENT:  Head: Normocephalic and atraumatic.  Neck: Normal range of motion.  Cardiovascular: Normal rate, regular rhythm, normal heart sounds and intact distal pulses. Exam reveals no gallop and no friction rub.  No murmur heard. Pulmonary/Chest: Effort normal and breath sounds normal. No respiratory distress. He has no wheezes. He has no rales. He exhibits no tenderness.  Abdominal: Soft. Bowel sounds are normal. He exhibits no distension. There is no guarding.  Musculoskeletal: Normal range of motion.  Neurological: He is alert and oriented to person, place, and time.  Skin: Skin is warm and dry. No rash noted. No cyanosis or erythema. No pallor. Nails show no clubbing.     Psychiatric: He has a normal mood and affect. His behavior is normal. Judgment and thought content normal.    BP 138/78   Pulse 99   Temp 98.1 F (36.7 C) (Oral)   Resp 16   Ht 5\' 11"  (1.803 m)   Wt 242 lb (109.8 kg)   SpO2 98%   BMI 33.75 kg/m  Wt Readings from Last 3 Encounters:  08/12/18  242 lb (109.8 kg)  09/14/17 252 lb (114.3 kg)  08/17/17 238 lb (108 kg)     Health Maintenance Due  Topic Date Due  . Hepatitis C Screening  10/06/1955  . HIV Screening  08/02/1972  . TETANUS/TDAP  08/02/1976  .  COLONOSCOPY  08/03/2007    There are no preventive care reminders to display for this patient.  No results found for: TSH Lab Results  Component Value Date   WBC 8.1 08/16/2017   HGB 12.9 (L) 08/16/2017   HCT 39.4 08/16/2017   MCV 63.2 (L) 08/16/2017   PLT 228 08/16/2017   Lab Results  Component Value Date   NA 143 09/14/2017   K 3.9 09/14/2017   CO2 24 09/14/2017   GLUCOSE 98 09/14/2017   BUN 16 09/14/2017   CREATININE 0.84 09/14/2017   ALBUMIN 4.2 09/14/2017   CALCIUM 9.1 09/14/2017   ANIONGAP 7 08/16/2017   Lab Results  Component Value Date   CHOL 211 (H) 08/17/2017   Lab Results  Component Value Date   HDL 86 08/17/2017   Lab Results  Component Value Date   LDLCALC 104 (H) 08/17/2017   Lab Results  Component Value Date   TRIG 107 08/17/2017   Lab Results  Component Value Date   CHOLHDL 2.5 08/17/2017   Lab Results  Component Value Date   HGBA1C 4.9 08/17/2017      Assessment & Plan:   Problem List Items Addressed This Visit    None    Visit Diagnoses    Encounter to establish care    -  Primary   Screening for endocrine, metabolic and immunity disorder       Relevant Orders   CBC with Differential/Platelet   Comprehensive metabolic panel   TSH   Hemoglobin A1c   Lipid screening       Relevant Orders   Lipid panel   Screening for viral disease       Relevant Orders   HIV antibody (with reflex)   Hepatitis C Antibody   Colon cancer screening       Relevant Orders   Cologuard   Asymptomatic hypertension       Relevant Medications   amLODipine (NORVASC) 5 MG tablet   chlorthalidone (HYGROTON) 25 MG tablet   Asymptomatic hypertension       Increase chlrothalidone to 25.  See AVS.  Three months return, sooner if greater  than 140/90 amb.    Relevant Medications   amLODipine (NORVASC) 5 MG tablet   chlorthalidone (HYGROTON) 25 MG tablet   Essential hypertension       Relevant Medications   amLODipine (NORVASC) 5 MG tablet   chlorthalidone (HYGROTON) 25 MG tablet      Meds ordered this encounter  Medications  . amLODipine (NORVASC) 5 MG tablet    Sig: Take 1 tablet (5 mg total) by mouth daily.    Dispense:  90 tablet    Refill:  3    Order Specific Question:   Supervising Provider    Answer:   Collie Siad A [id number]  . chlorthalidone (HYGROTON) 25 MG tablet    Sig: Take 1 tablet (25 mg total) by mouth daily.    Dispense:  90 tablet    Refill:  3    Order Specific Question:   Supervising Provider    Answer:   Doristine Bosworth [id number]    Follow-up: Return in 6 months (on 02/11/2019). for lipid check, BP check  PLAN:  Labs ordered - routine labs, as well as HIV, HepC. Preventative screening: pt opts for cologuard, understands that this comes with an increased frequency of testing as well as a chance that he will need a screening colonoscopy based on abnormal results.  Plan to continue current HTN  regimen of amlodipine and chlorthalidone. Will recheck BP and lipids in 6 mos - if current weight loss continues, we can discuss the possibility of changing this regimen.  Diet and exercise: discussed risks and benefits to keto diet. Pt has stopped consuming alcohol at this time - a positive change for him.   Patient encouraged to call clinic with any questions, comments, or concerns.    Thank you for your visit with Primary Care at [facility] today.  Janeece Agee, NP [facility] Primary Care at [facility] [address] [place], [place] [address] [phone]

## 2018-08-12 NOTE — Patient Instructions (Addendum)
   If you have lab work done today you will be contacted with your lab results within the next 2 weeks.  If you have not heard from us then please contact us. The fastest way to get your results is to register for My Chart.   IF you received an x-ray today, you will receive an invoice from [facility]. Please contact [facility] at [phone] with questions or concerns regarding your invoice.   IF you received labwork today, you will receive an invoice from [facility]. Please contact [facility] at [phone] with questions or concerns regarding your invoice.   Our billing staff will not be able to assist you with questions regarding bills from these companies.  You will be contacted with the lab results as soon as they are available. The fastest way to get your results is to activate your My Chart account. Instructions are located on the last page of this paperwork. If you have not heard from us regarding the results in 2 weeks, please contact this office.       Hypertension Hypertension, commonly called high blood pressure, is when the force of blood pumping through the arteries is too strong. The arteries are the blood vessels that carry blood from the heart throughout the body. Hypertension forces the heart to work harder to pump blood and may cause arteries to become narrow or stiff. Having untreated or uncontrolled hypertension can cause heart attacks, strokes, kidney disease, and other problems. A blood pressure reading consists of a higher number over a lower number. Ideally, your blood pressure should be below 120/80. The first ("top") number is called the systolic pressure. It is a measure of the pressure in your arteries as your heart beats. The second ("bottom") number is called the diastolic pressure. It is a measure of the pressure in your arteries as the heart relaxes. What are the causes? The cause of this condition is not known. What increases the  risk? Some risk factors for high blood pressure are under your control. Others are not. Factors you can change  Smoking.  Having type 2 diabetes mellitus, high cholesterol, or both.  Not getting enough exercise or physical activity.  Being overweight.  Having too much fat, sugar, calories, or salt (sodium) in your diet.  Drinking too much alcohol. Factors that are difficult or impossible to change  Having chronic kidney disease.  Having a family history of high blood pressure.  Age. Risk increases with age.  Race. You may be at higher risk if you are African-American.  Gender. Men are at higher risk than women before age 45. After age 65, women are at higher risk than men.  Having obstructive sleep apnea.  Stress. What are the signs or symptoms? Extremely high blood pressure (hypertensive crisis) may cause:  Headache.  Anxiety.  Shortness of breath.  Nosebleed.  Nausea and vomiting.  Severe chest pain.  Jerky movements you cannot control (seizures). How is this diagnosed? This condition is diagnosed by measuring your blood pressure while you are seated, with your arm resting on a surface. The cuff of the blood pressure monitor will be placed directly against the skin of your upper arm at the level of your heart. It should be measured at least twice using the same arm. Certain conditions can cause a difference in blood pressure between your right and left arms. Certain factors can cause blood pressure readings to be lower or higher than normal (elevated) for a short period of time:    When your blood pressure is higher when you are in a health care provider's office than when you are at home, this is called white coat hypertension. Most people with this condition do not need medicines.  When your blood pressure is higher at home than when you are in a health care provider's office, this is called masked hypertension. Most people with this condition may need medicines  to control blood pressure. If you have a high blood pressure reading during one visit or you have normal blood pressure with other risk factors:  You may be asked to return on a different day to have your blood pressure checked again.  You may be asked to monitor your blood pressure at home for 1 week or longer. If you are diagnosed with hypertension, you may have other blood or imaging tests to help your health care provider understand your overall risk for other conditions. How is this treated? This condition is treated by making healthy lifestyle changes, such as eating healthy foods, exercising more, and reducing your alcohol intake. Your health care provider may prescribe medicine if lifestyle changes are not enough to get your blood pressure under control, and if:  Your systolic blood pressure is above 130.  Your diastolic blood pressure is above 80. Your personal target blood pressure may vary depending on your medical conditions, your age, and other factors. Follow these instructions at home: Eating and drinking   Eat a diet that is high in fiber and potassium, and low in sodium, added sugar, and fat. An example eating plan is called the DASH (Dietary Approaches to Stop Hypertension) diet. To eat this way: ? Eat plenty of fresh fruits and vegetables. Try to fill half of your plate at each meal with fruits and vegetables. ? Eat whole grains, such as whole wheat pasta, brown rice, or whole grain bread. Fill about one quarter of your plate with whole grains. ? Eat or drink low-fat dairy products, such as skim milk or low-fat yogurt. ? Avoid fatty cuts of meat, processed or cured meats, and poultry with skin. Fill about one quarter of your plate with lean proteins, such as fish, chicken without skin, beans, eggs, and tofu. ? Avoid premade and processed foods. These tend to be higher in sodium, added sugar, and fat.  Reduce your daily sodium intake. Most people with hypertension should  eat less than 1,500 mg of sodium a day.  Limit alcohol intake to no more than 1 drink a day for nonpregnant women and 2 drinks a day for men. One drink equals 12 oz of beer, 5 oz of wine, or 1 oz of hard liquor. Lifestyle   Work with your health care provider to maintain a healthy body weight or to lose weight. Ask what an ideal weight is for you.  Get at least 30 minutes of exercise that causes your heart to beat faster (aerobic exercise) most days of the week. Activities may include walking, swimming, or biking.  Include exercise to strengthen your muscles (resistance exercise), such as pilates or lifting weights, as part of your weekly exercise routine. Try to do these types of exercises for 30 minutes at least 3 days a week.  Do not use any products that contain nicotine or tobacco, such as cigarettes and e-cigarettes. If you need help quitting, ask your health care provider.  Monitor your blood pressure at home as told by your health care provider.  Keep all follow-up visits as told by your health care provider.   This is important. Medicines  Take over-the-counter and prescription medicines only as told by your health care provider. Follow directions carefully. Blood pressure medicines must be taken as prescribed.  Do not skip doses of blood pressure medicine. Doing this puts you at risk for problems and can make the medicine less effective.  Ask your health care provider about side effects or reactions to medicines that you should watch for. Contact a health care provider if:  You think you are having a reaction to a medicine you are taking.  You have headaches that keep coming back (recurring).  You feel dizzy.  You have swelling in your ankles.  You have trouble with your vision. Get help right away if:  You develop a severe headache or confusion.  You have unusual weakness or numbness.  You feel faint.  You have severe pain in your chest or abdomen.  You vomit  repeatedly.  You have trouble breathing. Summary  Hypertension is when the force of blood pumping through your arteries is too strong. If this condition is not controlled, it may put you at risk for serious complications.  Your personal target blood pressure may vary depending on your medical conditions, your age, and other factors. For most people, a normal blood pressure is less than 120/80.  Hypertension is treated with lifestyle changes, medicines, or a combination of both. Lifestyle changes include weight loss, eating a healthy, low-sodium diet, exercising more, and limiting alcohol. This information is not intended to replace advice given to you by your health care provider. Make sure you discuss any questions you have with your health care provider. Document Released: 02/27/2005 Document Revised: 01/26/2016 Document Reviewed: 01/26/2016 Elsevier Interactive Patient Education  2019 Elsevier Inc.  

## 2018-08-13 LAB — HEPATITIS C ANTIBODY: Hep C Virus Ab: <0.1 s/co ratio (ref 0.0–0.9)

## 2018-08-13 LAB — LIPID PANEL
Chol/HDL Ratio: 2.7 ratio (ref 0.0–5.0)
Cholesterol, Total: 203 mg/dL — ABNORMAL HIGH (ref 100–199)
HDL: 75 mg/dL (ref >39)
LDL Calculated: 114 mg/dL — ABNORMAL HIGH (ref 0–99)
Triglycerides: 70 mg/dL (ref 0–149)
VLDL Cholesterol Cal: 14 mg/dL (ref 5–40)

## 2018-08-13 LAB — CBC WITH DIFFERENTIAL/PLATELET
Basophils Absolute: 0.1 10*3/uL (ref 0.0–0.2)
Basos: 1 %
EOS (ABSOLUTE): 0.2 10*3/uL (ref 0.0–0.4)
Eos: 3 %
Hematocrit: 39.4 % (ref 37.5–51.0)
Hemoglobin: 12.6 g/dL — ABNORMAL LOW (ref 13.0–17.7)
Immature Grans (Abs): 0 10*3/uL (ref 0.0–0.1)
Immature Granulocytes: 0 %
Lymphocytes Absolute: 1.5 10*3/uL (ref 0.7–3.1)
Lymphs: 27 %
MCH: 20.6 pg — ABNORMAL LOW (ref 26.6–33.0)
MCHC: 32 g/dL (ref 31.5–35.7)
MCV: 64 fL — ABNORMAL LOW (ref 79–97)
Monocytes Absolute: 0.5 10*3/uL (ref 0.1–0.9)
Monocytes: 9 %
Neutrophils Absolute: 3.4 10*3/uL (ref 1.4–7.0)
Neutrophils: 60 %
Platelets: 268 10*3/uL (ref 150–450)
RBC: 6.12 x10E6/uL — ABNORMAL HIGH (ref 4.14–5.80)
RDW: 18.1 % — ABNORMAL HIGH (ref 11.6–15.4)
WBC: 5.7 10*3/uL (ref 3.4–10.8)

## 2018-08-13 LAB — TSH: TSH: 1.5 u[IU]/mL (ref 0.450–4.500)

## 2018-08-13 LAB — HEMOGLOBIN A1C
Est. average glucose Bld gHb Est-mCnc: 91 mg/dL
Hgb A1c MFr Bld: 4.8 % (ref 4.8–5.6)

## 2018-08-13 LAB — COMPREHENSIVE METABOLIC PANEL
ALT: 69 IU/L — ABNORMAL HIGH (ref 0–44)
AST: 44 IU/L — ABNORMAL HIGH (ref 0–40)
Albumin/Globulin Ratio: 2.6 — ABNORMAL HIGH (ref 1.2–2.2)
Albumin: 4.7 g/dL (ref 3.8–4.8)
Alkaline Phosphatase: 40 IU/L (ref 39–117)
BUN/Creatinine Ratio: 17 (ref 10–24)
BUN: 15 mg/dL (ref 8–27)
Bilirubin Total: 1.5 mg/dL — ABNORMAL HIGH (ref 0.0–1.2)
CO2: 26 mmol/L (ref 20–29)
Calcium: 9.8 mg/dL (ref 8.6–10.2)
Chloride: 100 mmol/L (ref 96–106)
Creatinine, Ser: 0.9 mg/dL (ref 0.76–1.27)
GFR calc Af Amer: 106 mL/min/{1.73_m2} (ref >59)
GFR calc non Af Amer: 92 mL/min/{1.73_m2} (ref >59)
Globulin, Total: 1.8 g/dL (ref 1.5–4.5)
Glucose: 106 mg/dL — ABNORMAL HIGH (ref 65–99)
Potassium: 3.7 mmol/L (ref 3.5–5.2)
Sodium: 140 mmol/L (ref 134–144)
Total Protein: 6.5 g/dL (ref 6.0–8.5)

## 2018-08-13 LAB — HIV ANTIBODY (ROUTINE TESTING W REFLEX): HIV Screen 4th Generation wRfx: NONREACTIVE

## 2018-09-24 ENCOUNTER — Encounter: Payer: Self-pay | Admitting: Registered Nurse

## 2018-09-26 ENCOUNTER — Other Ambulatory Visit: Payer: Self-pay | Admitting: *Deleted

## 2018-09-26 DIAGNOSIS — I10 Essential (primary) hypertension: Secondary | ICD-10-CM

## 2018-09-26 MED ORDER — CHLORTHALIDONE 25 MG PO TABS: 25.0000 mg | ORAL_TABLET | Freq: Every day | ORAL | 3 refills | Status: DC

## 2018-10-03 ENCOUNTER — Other Ambulatory Visit: Payer: Self-pay

## 2018-10-03 DIAGNOSIS — I10 Essential (primary) hypertension: Secondary | ICD-10-CM

## 2018-10-03 MED ORDER — AMLODIPINE BESYLATE 5 MG PO TABS: 5.0000 mg | ORAL_TABLET | Freq: Every day | ORAL | 3 refills | Status: DC

## 2018-11-12 ENCOUNTER — Ambulatory Visit (INDEPENDENT_AMBULATORY_CARE_PROVIDER_SITE_OTHER): Payer: 59 | Admitting: Family Medicine

## 2018-11-12 ENCOUNTER — Other Ambulatory Visit: Payer: Self-pay

## 2018-11-12 ENCOUNTER — Encounter: Payer: Self-pay | Admitting: Family Medicine

## 2018-11-12 VITALS — BP 140/80 | HR 89 | Temp 99.1°F | Resp 14 | Wt 259.4 lb

## 2018-11-12 DIAGNOSIS — Z23 Encounter for immunization: Secondary | ICD-10-CM

## 2018-11-12 DIAGNOSIS — M7989 Other specified soft tissue disorders: Secondary | ICD-10-CM | POA: Diagnosis not present

## 2018-11-12 DIAGNOSIS — R600 Localized edema: Secondary | ICD-10-CM | POA: Diagnosis not present

## 2018-11-12 DIAGNOSIS — M79661 Pain in right lower leg: Secondary | ICD-10-CM

## 2018-11-12 NOTE — Patient Instructions (Addendum)
   This is suspicious for a possible blood clot of the right calf and we are ordering an ultrasound to be done as soon as possible.  Elevate leg to reduce swelling.  Take Tylenol (acetaminophen) 5 mg 2 pills 3 times daily as needed for pain.  [facility] 9 AM Wednesday  (enter to the right of the cancer center)  In the event of worsening, shortness of breath, or chest pain go to the emergency room  If you have lab work done today you will be contacted with your lab results within the next 2 weeks.  If you have not heard from [place] then please contact us. The fastest way to get your results is to register for My Chart.   IF you received an x-ray today, you will receive an invoice from [facility]. Please contact [facility] at [phone] with questions or concerns regarding your invoice.   IF you received labwork today, you will receive an invoice from [facility]. Please contact [facility] at [phone] with questions or concerns regarding your invoice.   Our billing staff will not be able to assist you with questions regarding bills from these companies.  You will be contacted with the lab results as soon as they are available. The fastest way to get your results is to activate your My Chart account. Instructions are located on the last page of this paperwork. If you have not heard from [place] regarding the results in 2 weeks, please contact this office.

## 2018-11-12 NOTE — Progress Notes (Signed)
Patient ID: Gavin RosenthalStephen N Walker, male    DOB: 01-25-1956  Age: 63 y.o. MRN: [id number]  Chief Complaint  Patient presents with  . Leg Pain    leg pain in the right leg/calf area. Pain started sun 8/30 then the swelling started 11/11/18.    Subjective:   63 year old male who has been having pain in his calf since Sunday.  He usually walks 4 miles a day.  He had sat down and stood back up and noticed pain in his calf.  It got worse since then with more swelling.  No discoloration.  No coldness.  Is just swollen from the knee down.  He is tender in the right calf  Current allergies, medications, problem list, past/family and social histories reviewed.  Objective:  BP 140/80 (BP Location: Right Arm, Patient Position: Sitting, Cuff Size: Large)   Pulse 89   Temp 99.1 F (37.3 C) (Oral)   Resp 14   Wt 259 lb 6.4 oz (117.7 kg)   SpO2 97%   BMI 36.18 kg/m   Swelling of leg as noted above.  He swollen in the popliteal fossa little no tenderness or pain in the thigh.  Pain to palpation over the medial calf.  No discoloration, erythema, or warmth.  Positive Homans sign.  Assessment & Plan:   Assessment: 1. Right calf pain   2. Need for prophylactic vaccination and inoculation against influenza   3. Leg swelling   4. Leg edema, right       Plan: Very suspicious for DVT.  Could not get an ultrasound this evening but have it scheduled for 9 AM tomorrow.  Patient understands he needs to go to the ER if at all worse.  Orders Placed This Encounter  Procedures  . Flu Vaccine QUAD 6+ mos PF IM (Fluarix Quad PF)    No orders of the defined types were placed in this encounter.        Patient Instructions     This is suspicious for a possible blood clot of the right calf and we are ordering an ultrasound to be done as soon as possible.  Elevate leg to reduce swelling.  Take Tylenol (acetaminophen) 5 mg 2 pills 3 times daily as needed for pain.  [facility] 9 [facility]  Wednesday  (enter to the right of the cancer center)  In the event of worsening, shortness of breath, or chest pain go to the emergency room  If you have lab work done today you will be contacted with your lab results within the next 2 weeks.  If you have not heard from Gavin Walker then please contact Gavin Walker. The fastest way to get your results is to register for My Chart.   IF you received an x-ray today, you will receive an invoice from [facility]. Please contact [facility] at [phone] with questions or concerns regarding your invoice.   IF you received labwork today, you will receive an invoice from [facility]. Please contact [facility] at [phone] with questions or concerns regarding your invoice.   Our billing staff will not be able to assist you with questions regarding bills from these companies.  You will be contacted with the lab results as soon as they are available. The fastest way to get your results is to activate your My Chart account. Instructions are located on the last page of this paperwork. If you have not heard from Gavin Walker regarding the results in 2 weeks, please contact this office.  Return if symptoms worsen or fail to improve.   Ruben Reason, MD 11/12/2018

## 2018-11-13 ENCOUNTER — Ambulatory Visit (HOSPITAL_COMMUNITY)
Admission: RE | Admit: 2018-11-13 | Discharge: 2018-11-13 | Disposition: A | Discharge status: Home / Self Care | Payer: 59 | Source: Ambulatory Visit | Attending: Family Medicine | Admitting: Family Medicine

## 2018-11-13 DIAGNOSIS — M79661 Pain in right lower leg: Secondary | ICD-10-CM | POA: Diagnosis not present

## 2018-11-13 NOTE — Progress Notes (Signed)
Right lower extremity venous duplex has been completed. Preliminary results can be found in CV Proc through chart review.  Results were given to Dr. Linna Darner.  11/13/18 9:25 AM Gavin Walker RVT

## 2019-01-20 ENCOUNTER — Other Ambulatory Visit: Payer: Self-pay

## 2019-01-20 DIAGNOSIS — Z20822 Contact with and (suspected) exposure to covid-19: Secondary | ICD-10-CM

## 2019-01-21 LAB — NOVEL CORONAVIRUS, NAA: SARS-CoV-2, NAA: NOT DETECTED

## 2019-05-19 ENCOUNTER — Ambulatory Visit: Payer: 59 | Attending: Internal Medicine

## 2019-05-19 DIAGNOSIS — Z23 Encounter for immunization: Secondary | ICD-10-CM

## 2019-05-19 NOTE — Progress Notes (Signed)
   Covid-19 Vaccination Clinic  Name:  TYBERIUS RYNER    MRN: [id number] DOB: Nov 22, 1955  05/19/2019  Mr. Kretsch was observed post Covid-19 immunization for 15 minutes without incident. He was provided with Vaccine Information Sheet and instruction to access the V-Safe system.   Mr. Cambria was instructed to call 911 with any severe reactions post vaccine: Marland Kitchen Difficulty breathing  . Swelling of face and throat  . A fast heartbeat  . A bad rash all over body  . Dizziness and weakness   Immunizations Administered    Name Date Dose VIS Date Route   Pfizer COVID-19 Vaccine 05/19/2019  3:26 PM 0.3 mL 02/21/2019 Intramuscular   Manufacturer: [employer], [employer]   Lot: [id number]   NDC: [address]-8

## 2019-06-18 ENCOUNTER — Ambulatory Visit: Payer: 59 | Attending: Internal Medicine

## 2019-06-18 DIAGNOSIS — Z23 Encounter for immunization: Secondary | ICD-10-CM

## 2019-06-18 NOTE — Progress Notes (Signed)
   Covid-19 Vaccination Clinic  Name:  Gavin Walker    MRN: [id number] DOB: 1956/01/20  06/18/2019  Mr. Vital was observed post Covid-19 immunization for 15 minutes without incident. He was provided with Vaccine Information Sheet and instruction to access the V-Safe system.   [name] was instructed to call 911 with any severe reactions post vaccine: Marland Kitchen Difficulty breathing  . Swelling of face and throat  . A fast heartbeat  . A bad rash all over body  . Dizziness and weakness   Immunizations Administered    Name Date Dose VIS Date Route   Pfizer COVID-19 Vaccine 06/18/2019  2:54 PM 0.3 mL 02/21/2019 Intramuscular   Manufacturer: [employer], [employer]   Lot: [id number]   NDC: [address]-8

## 2019-07-04 ENCOUNTER — Other Ambulatory Visit: Payer: Self-pay | Admitting: Registered Nurse

## 2019-07-04 DIAGNOSIS — I10 Essential (primary) hypertension: Secondary | ICD-10-CM

## 2019-07-27 ENCOUNTER — Other Ambulatory Visit: Payer: Self-pay | Admitting: Registered Nurse

## 2019-07-27 DIAGNOSIS — I10 Essential (primary) hypertension: Secondary | ICD-10-CM

## 2020-06-27 ENCOUNTER — Other Ambulatory Visit: Payer: Self-pay | Admitting: Registered Nurse

## 2020-06-27 DIAGNOSIS — I10 Essential (primary) hypertension: Secondary | ICD-10-CM

## 2020-06-28 NOTE — Telephone Encounter (Signed)
Needs OV before further fills

## 2020-08-10 ENCOUNTER — Other Ambulatory Visit: Payer: Self-pay | Admitting: Registered Nurse

## 2020-08-10 DIAGNOSIS — I10 Essential (primary) hypertension: Secondary | ICD-10-CM

## 2020-09-07 ENCOUNTER — Other Ambulatory Visit: Payer: Self-pay | Admitting: Registered Nurse

## 2020-09-07 DIAGNOSIS — I10 Essential (primary) hypertension: Secondary | ICD-10-CM

## 2023-07-24 ENCOUNTER — Encounter: Payer: Self-pay | Admitting: Family

## 2023-07-24 ENCOUNTER — Ambulatory Visit (INDEPENDENT_AMBULATORY_CARE_PROVIDER_SITE_OTHER): Payer: Self-pay | Admitting: Family

## 2023-07-24 VITALS — BP 160/89 | HR 75 | Temp 98.5°F | Ht 71.0 in | Wt 265.0 lb

## 2023-07-24 DIAGNOSIS — G4733 Obstructive sleep apnea (adult) (pediatric): Secondary | ICD-10-CM | POA: Diagnosis not present

## 2023-07-24 DIAGNOSIS — I10 Essential (primary) hypertension: Secondary | ICD-10-CM

## 2023-07-24 DIAGNOSIS — Z1329 Encounter for screening for other suspected endocrine disorder: Secondary | ICD-10-CM | POA: Diagnosis not present

## 2023-07-24 MED ORDER — AMLODIPINE BESYLATE 10 MG PO TABS: 10.0000 mg | ORAL_TABLET | Freq: Every day | ORAL | 1 refills | Status: DC

## 2023-07-24 MED ORDER — TIRZEPATIDE-WEIGHT MANAGEMENT 7.5 MG/0.5ML ~~LOC~~ SOLN: 7.5000 mg | SUBCUTANEOUS | 3 refills | Status: DC

## 2023-07-24 MED ORDER — HYDROCHLOROTHIAZIDE 50 MG PO TABS
50.0000 mg | ORAL_TABLET | Freq: Every day | ORAL | 1 refills | Status: AC
Start: 2023-07-24 — End: ?

## 2023-07-24 NOTE — Progress Notes (Unsigned)
 Location:      Place of Service:    Provider: Whitt Auletta FNP-C   Ulyess Gammons, NP  Patient Care Team: Ulyess Gammons, NP as PCP - General (Adult Health Nurse Practitioner)  Extended Emergency Contact Information Primary Emergency Contact: Short,Peggy Address: [address]          [place], [place] [address] [place] Mobile Phone: [phone] Relation: Spouse Secondary Emergency Contact: [facility] Phone: [phone] Relation: Friend Preferred language: Andra Banco Interpreter needed? No  Code Status:  *** Goals of care: Advanced Directive information     No data to display           Chief Complaint  Patient presents with   New Patient (Initial Visit)    Discuss getting medication for b/p and discuss weight loss medication pt hasn't tried any meds in past    HPI:  Pt is a 68 y.o. male seen today for medical management of chronic diseases.   Discussed the use of AI scribe software for clinical note transcription with the patient, who gave verbal consent to proceed.  History of Present Illness             Past Medical History:  Diagnosis Date   Hypertension    Sleep apnea    Past Surgical History:  Procedure Laterality Date   APPENDECTOMY     arm fracture     repair right arm/wrist    Allergies  Allergen Reactions   Penicillins     Has patient had a PCN reaction causing immediate rash, facial/tongue/throat swelling, SOB or lightheadedness with hypotension: Y Has patient had a PCN reaction causing severe rash involving mucus membranes or skin necrosis: Y Has patient had a PCN reaction that required hospitalization: N Has patient had a PCN reaction occurring within the last 10 years: N If all of the above answers are "NO", then may proceed with Cephalosporin use.    Shellfish Allergy     Allergies as of 07/24/2023       Reactions   Penicillins    Has patient had a PCN reaction causing immediate rash,  facial/tongue/throat swelling, SOB or lightheadedness with hypotension: Y Has patient had a PCN reaction causing severe rash involving mucus membranes or skin necrosis: Y Has patient had a PCN reaction that required hospitalization: N Has patient had a PCN reaction occurring within the last 10 years: N If all of the above answers are "NO", then may proceed with Cephalosporin use.   Shellfish Allergy         Medication List        Accurate as of Jul 24, 2023  2:47 PM. If you have any questions, ask your nurse or doctor.          STOP taking these medications    chlorthalidone  25 MG tablet Commonly known as: HYGROTON  Stopped by: Cason Dabney C Maripaz Mullan       TAKE these medications    amLODipine  10 MG tablet Commonly known as: NORVASC  What changed: Another medication with the same name was removed. Continue taking this medication, and follow the directions you see here. Changed by: Waldron Gerry C Vonnie Ligman   CO Q-10 PO Take 1 tablet by mouth daily.   Fish Oil 1000 MG Caps Take 1 capsule by mouth daily.   hydrochlorothiazide 50 MG tablet Commonly known as: HYDRODIURIL Take 50 mg by mouth daily. What changed: Another medication with the same name was removed. Continue taking this medication, and follow the directions you see  here. Changed by: Jerimie Mancuso C Ernestene Coover   IRON PO Take 1 tablet by mouth daily.   KRILL OIL PO Take by mouth.   TURMERIC PO Take 1 capsule by mouth daily.   VITAMIN B COMPLEX PO Take 1 tablet by mouth daily.   Vitamin D3 125 MCG (5000 UT) Caps Take 1 capsule by mouth daily.        Review of Systems  Immunization History  Administered Date(s) Administered   Influenza,inj,Quad PF,6+ Mos 11/12/2018   PFIZER(Purple Top)SARS-COV-2 Vaccination 05/19/2019, 06/18/2019   Tdap 10/22/2016   Pertinent  Health Maintenance Due  Topic Date Due   Colonoscopy  07/23/2024 (Originally 08/03/2002)   INFLUENZA VACCINE  10/12/2023      08/17/2017   10:56 AM 09/14/2017     8:17 AM 08/12/2018    8:18 AM 11/12/2018    3:29 PM 07/24/2023    1:49 PM  Fall Risk  Falls in the past year? No No 0 0 0  Was there an injury with Fall?   0 0 0  Fall Risk Category Calculator    0 0  Fall Risk Category (Retired)    Low   (RETIRED) Patient Fall Risk Level   Low fall risk    Patient at Risk for Falls Due to     No Fall Risks  Fall risk Follow up     Falls prevention discussed;Falls evaluation completed   Functional Status Survey:    Vitals:   07/24/23 1349  BP: (!) 160/92  Pulse: 75  Temp: 98.5 F (36.9 C)  TempSrc: Temporal  SpO2: 98%  Weight: 265 lb (120.2 kg)  Height: 5\' 11"  (1.803 m)   Body mass index is 36.96 kg/m. Physical Exam Physical Exam          Labs reviewed: No results for input(s): "NA", "K", "CL", "CO2", "GLUCOSE", "BUN", "CREATININE", "CALCIUM", "MG", "PHOS" in the last 8760 hours. No results for input(s): "AST", "ALT", "ALKPHOS", "BILITOT", "PROT", "ALBUMIN" in the last 8760 hours. No results for input(s): "WBC", "NEUTROABS", "HGB", "HCT", "MCV", "PLT" in the last 8760 hours. Lab Results  Component Value Date   TSH 1.500 08/12/2018   Lab Results  Component Value Date   HGBA1C 4.8 08/12/2018   Lab Results  Component Value Date   CHOL 203 (H) 08/12/2018   HDL 75 08/12/2018   LDLCALC 114 (H) 08/12/2018   TRIG 70 08/12/2018   CHOLHDL 2.7 08/12/2018    Significant Diagnostic Results in last 30 days:  No results found.  Assessment/Plan Assessment and Plan               Family/ staff Communication: Reviewed plan of care with patient  Labs/tests ordered: None   Next Appointment : No follow-ups on file.   Spent 30 minutes of Face to face and non-face to face with patient  >50% time spent counseling; reviewing medical record; tests; labs; documentation and developing future plan of care.   Estil Heman, NP

## 2023-07-25 LAB — COMPLETE METABOLIC PANEL WITHOUT GFR
AG Ratio: 1.7 (calc) (ref 1.0–2.5)
ALT: 83 U/L — ABNORMAL HIGH (ref 9–46)
AST: 56 U/L — ABNORMAL HIGH (ref 10–35)
Albumin: 4.7 g/dL (ref 3.6–5.1)
Alkaline phosphatase (APISO): 48 U/L (ref 35–144)
BUN: 13 mg/dL (ref 7–25)
CO2: 30 mmol/L (ref 20–32)
Calcium: 9.8 mg/dL (ref 8.6–10.3)
Chloride: 99 mmol/L (ref 98–110)
Creat: 0.95 mg/dL (ref 0.70–1.35)
Globulin: 2.7 g/dL (ref 1.9–3.7)
Glucose, Bld: 99 mg/dL (ref 65–99)
Potassium: 4.1 mmol/L (ref 3.5–5.3)
Sodium: 140 mmol/L (ref 135–146)
Total Bilirubin: 2.3 mg/dL — ABNORMAL HIGH (ref 0.2–1.2)
Total Protein: 7.4 g/dL (ref 6.1–8.1)

## 2023-07-25 LAB — LIPID PANEL
Cholesterol: 227 mg/dL — ABNORMAL HIGH (ref <200)
HDL: 77 mg/dL (ref >=40)
LDL Cholesterol (Calc): 120 mg/dL — ABNORMAL HIGH
Non-HDL Cholesterol (Calc): 150 mg/dL — ABNORMAL HIGH (ref <130)
Total CHOL/HDL Ratio: 2.9 (calc) (ref <5.0)
Triglycerides: 178 mg/dL — ABNORMAL HIGH (ref <150)

## 2023-07-25 LAB — TSH: TSH: 1.82 m[IU]/L (ref 0.40–4.50)

## 2023-07-25 LAB — CBC WITH DIFFERENTIAL/PLATELET
Absolute Lymphocytes: 1843 {cells}/uL (ref 850–3900)
Absolute Monocytes: 677 {cells}/uL (ref 200–950)
Basophils Absolute: 58 {cells}/uL (ref 0–200)
Basophils Relative: 0.8 %
Eosinophils Absolute: 94 {cells}/uL (ref 15–500)
Eosinophils Relative: 1.3 %
HCT: 42.1 % (ref 38.5–50.0)
Hemoglobin: 13.1 g/dL — ABNORMAL LOW (ref 13.2–17.1)
MCH: 20.2 pg — ABNORMAL LOW (ref 27.0–33.0)
MCHC: 31.1 g/dL — ABNORMAL LOW (ref 32.0–36.0)
MCV: 64.9 fL — ABNORMAL LOW (ref 80.0–100.0)
MPV: 10.3 fL (ref 7.5–12.5)
Monocytes Relative: 9.4 %
Neutro Abs: 4529 {cells}/uL (ref 1500–7800)
Neutrophils Relative %: 62.9 %
Platelets: 249 10*3/uL (ref 140–400)
RBC: 6.49 10*6/uL — ABNORMAL HIGH (ref 4.20–5.80)
RDW: 18.7 % — ABNORMAL HIGH (ref 11.0–15.0)
Total Lymphocyte: 25.6 %
WBC: 7.2 10*3/uL (ref 3.8–10.8)

## 2023-07-25 LAB — CBC MORPHOLOGY

## 2023-08-13 ENCOUNTER — Other Ambulatory Visit: Payer: Self-pay | Admitting: Family

## 2023-08-13 ENCOUNTER — Ambulatory Visit: Payer: Self-pay | Admitting: Family

## 2023-08-13 DIAGNOSIS — G4733 Obstructive sleep apnea (adult) (pediatric): Secondary | ICD-10-CM

## 2023-08-13 MED ORDER — TIRZEPATIDE 10 MG/0.5ML ~~LOC~~ SOAJ: 10.0000 mg | SUBCUTANEOUS | 3 refills | Status: DC

## 2023-08-13 NOTE — Telephone Encounter (Signed)
 Message routed to [place], NP

## 2023-08-14 ENCOUNTER — Telehealth: Payer: Self-pay | Admitting: *Deleted

## 2023-08-14 DIAGNOSIS — G4733 Obstructive sleep apnea (adult) (pediatric): Secondary | ICD-10-CM

## 2023-08-14 NOTE — Telephone Encounter (Signed)
Requested medication not on active medication list

## 2023-08-14 NOTE — Telephone Encounter (Signed)
 IT sales professional called and wanted to confirm. Stated that patient has been getting Zepbound filled 7.5mg  vial on 07/24/2023.   They received a Rx yesterday for Mounjaro and they do NOT provide this medication at this pharmacy.   Please Clarify.

## 2023-08-14 NOTE — Telephone Encounter (Signed)
 Please notify patient.

## 2023-08-15 NOTE — Telephone Encounter (Signed)
Tried calling patient, LMOM to return call.

## 2023-08-16 ENCOUNTER — Other Ambulatory Visit: Payer: Self-pay | Admitting: Family

## 2023-08-16 MED ORDER — ZEPBOUND 10 MG/0.5ML ~~LOC~~ SOLN: 10.0000 mg | SUBCUTANEOUS | 3 refills | Status: DC

## 2023-08-16 NOTE — Telephone Encounter (Signed)
 Tried calling patient, LMOM to return call.  MyChart message sent to patient.

## 2023-08-16 NOTE — Addendum Note (Signed)
 Addended byChristean Courts C on: 08/16/2023 12:57 PM   Modules accepted: Orders

## 2023-08-16 NOTE — Telephone Encounter (Signed)
 Zepbound 10 mg weekly injection  vial send to [facility] as requested.

## 2023-08-27 ENCOUNTER — Encounter: Payer: Self-pay | Admitting: Family

## 2023-08-27 ENCOUNTER — Ambulatory Visit (INDEPENDENT_AMBULATORY_CARE_PROVIDER_SITE_OTHER): Admitting: Family

## 2023-08-27 VITALS — BP 142/88 | HR 77 | Temp 97.7°F | Resp 20 | Ht 71.0 in | Wt 243.4 lb

## 2023-08-27 DIAGNOSIS — E782 Mixed hyperlipidemia: Secondary | ICD-10-CM

## 2023-08-27 DIAGNOSIS — Z23 Encounter for immunization: Secondary | ICD-10-CM

## 2023-08-27 DIAGNOSIS — I10 Essential (primary) hypertension: Secondary | ICD-10-CM

## 2023-08-27 DIAGNOSIS — G4733 Obstructive sleep apnea (adult) (pediatric): Secondary | ICD-10-CM | POA: Diagnosis not present

## 2023-08-27 MED ORDER — ZEPBOUND 10 MG/0.5ML ~~LOC~~ SOLN: 10.0000 mg | SUBCUTANEOUS | 3 refills | Status: DC

## 2023-08-27 NOTE — Progress Notes (Signed)
 Provider: Christean Courts FNP-C  Gavin Walker, Gavin Guadeloupe, NP  Patient Care Team: Rennae Ferraiolo, Gavin Guadeloupe, NP as PCP - General (Family Medicine)  Extended Emergency Contact Information Primary Emergency Contact: Ollis,Peggy Address: [address]          [place], [place] [address] [place] Mobile Phone: [phone] Relation: Spouse Secondary Emergency Contact: [facility] Phone: [phone] Relation: Friend Preferred language: Andra Banco Interpreter needed? No  Code Status:  Full Code  Goals of care: Advanced Directive information    08/27/2023    2:49 PM  Advanced Directives  Does Patient Have a Medical Advance Directive? No  Would patient like information on creating a medical advance directive? No - Patient declined     Chief Complaint  Patient presents with   Medical Management of Chronic Issues    1 month follow up for weight mangement    Discussed the use of AI scribe software for clinical note transcription with the patient, who gave verbal consent to proceed.  History of Present Illness   Gavin Walker is a 68 year old male who presents for a one-month follow-up for weight management.  He has been using Zepbound  for weight management and recently increased his dose to 10 mg as of yesterday. He experiences mild constipation, which is effectively managed with fiber supplementation. He notes a significant reduction in food intake, estimating he consumes about half of what he used to.  His weight has decreased from 265 pounds to 243.4 pounds since the last visit. He attributes part of his weight loss to reduced alcohol consumption, having only had two glasses of wine since the last visit. He maintains his usual physical activity, walking 8,000 to 10,000 steps daily, a routine he has followed for ten years.  No nausea, vomiting, or palpitations. He feels full faster and has no issues with sleep, maintaining the use of a CPAP machine for sleep apnea,  which he has used for nearly twenty years.  He manages his medication through [employer], paying with his health savings account, and has not encountered any insurance issues.   Past Medical History:  Diagnosis Date   Hypertension    Sleep apnea    Past Surgical History:  Procedure Laterality Date   APPENDECTOMY     arm fracture     repair right arm/wrist    Allergies  Allergen Reactions   Penicillins     Has patient had a PCN reaction causing immediate rash, facial/tongue/throat swelling, SOB or lightheadedness with hypotension: Y Has patient had a PCN reaction causing severe rash involving mucus membranes or skin necrosis: Y Has patient had a PCN reaction that required hospitalization: N Has patient had a PCN reaction occurring within the last 10 years: N If all of the above answers are NO, then may proceed with Cephalosporin use.    Shellfish Allergy     Outpatient Encounter Medications as of 08/27/2023  Medication Sig   amLODipine  (NORVASC ) 10 MG tablet Take 1 tablet (10 mg total) by mouth daily.   B Complex Vitamins (VITAMIN B COMPLEX PO) Take 1 tablet by mouth daily.   Cholecalciferol (VITAMIN D3) 125 MCG (5000 UT) CAPS Take 1 capsule by mouth daily.   Coenzyme Q10 (CO Q-10 PO) Take 1 tablet by mouth daily.   hydrochlorothiazide  (HYDRODIURIL ) 50 MG tablet Take 1 tablet (50 mg total) by mouth daily.   IRON PO Take 1 tablet by mouth daily.   KRILL OIL PO Take by mouth.  Omega-3 Fatty Acids (FISH OIL) 1000 MG CAPS Take 1 capsule by mouth daily.   Zinc 50 MG TABS Take by mouth daily.   [DISCONTINUED] tirzepatide  (ZEPBOUND ) 10 MG/0.5ML injection vial Inject 10 mg into the skin once a week.   tirzepatide  (ZEPBOUND ) 10 MG/0.5ML injection vial Inject 10 mg into the skin once a week.   TURMERIC PO Take 1 capsule by mouth daily. (Patient not taking: Reported on 08/27/2023)   No facility-administered encounter medications on file as of 08/27/2023.    Review of Systems   Constitutional:  Negative for appetite change, chills, fatigue, fever and unexpected weight change.  HENT:  Negative for congestion, ear discharge, ear pain, hearing loss, nosebleeds, postnasal drip, rhinorrhea, sinus pressure, sinus pain, sneezing, sore throat, tinnitus and trouble swallowing.   Eyes:  Negative for pain, discharge, redness, itching and visual disturbance.  Respiratory:  Negative for cough, chest tightness, shortness of breath and wheezing.   Cardiovascular:  Negative for chest pain, palpitations and leg swelling.  Gastrointestinal:  Negative for abdominal distention, abdominal pain, constipation, diarrhea, nausea and vomiting.  Musculoskeletal:  Negative for arthralgias, back pain, gait problem, joint swelling, myalgias, neck pain and neck stiffness.  Skin:  Negative for color change, pallor and rash.  Neurological:  Negative for dizziness, weakness, light-headedness, numbness and headaches.    Immunization History  Administered Date(s) Administered   Influenza,inj,Quad PF,6+ Mos 11/12/2018   PFIZER(Purple Top)SARS-COV-2 Vaccination 05/19/2019, 06/18/2019   PNEUMOCOCCAL CONJUGATE-20 08/27/2023   Tdap 10/22/2016   Pertinent  Health Maintenance Due  Topic Date Due   Colonoscopy  07/23/2024 (Originally 08/03/2002)   INFLUENZA VACCINE  10/12/2023      08/17/2017   10:56 AM 09/14/2017    8:17 AM 08/12/2018    8:18 AM 11/12/2018    3:29 PM 07/24/2023    1:49 PM  Fall Risk  Falls in the past year? No  No  0  0  0  Was there an injury with Fall?   0 0 0  Fall Risk Category Calculator    0 0  Fall Risk Category (Retired)    Low    (RETIRED) Patient Fall Risk Level   Low fall risk     Patient at Risk for Falls Due to     No Fall Risks  Fall risk Follow up     Falls prevention discussed;Falls evaluation completed     Data saved with a previous flowsheet row definition   Functional Status Survey:    Vitals:   08/27/23 1452  BP: (!) 142/88  Pulse: 77  Resp: 20  Temp:  97.7 F (36.5 C)  SpO2: 99%  Weight: 243 lb 6.4 oz (110.4 kg)  Height: 5' 11 (1.803 m)   Body mass index is 33.95 kg/m. Physical Exam VITALS: T- 97.7, P- 77, BP- 160/92 MEASUREMENTS: Weight- 243.4. GENERAL: Alert, cooperative, well developed, no acute distress. HEENT: Normocephalic, normal oropharynx, moist mucous membranes. CHEST: Clear to auscultation bilaterally, no wheezes, rhonchi, or crackles. CARDIOVASCULAR: Normal heart rate and rhythm, S1 and S2 normal without murmurs. ABDOMEN: Soft, non-tender, non-distended, without organomegaly, normal bowel sounds. EXTREMITIES: No cyanosis or edema. NEUROLOGICAL: Cranial nerves grossly intact, moves all extremities without gross motor or sensory deficit.  SKIN: No rash,no lesion or erythema   PSYCHIATRY/BEHAVIORAL: Mood stable   Labs reviewed: Recent Labs    07/24/23 1504  NA 140  K 4.1  CL 99  CO2 30  GLUCOSE 99  BUN 13  CREATININE 0.95  CALCIUM 9.8  Recent Labs    07/24/23 1504  AST 56*  ALT 83*  BILITOT 2.3*  PROT 7.4   Recent Labs    07/24/23 1504  WBC 7.2  NEUTROABS 4,529  HGB 13.1*  HCT 42.1  MCV 64.9*  PLT 249   Lab Results  Component Value Date   TSH 1.82 07/24/2023   Lab Results  Component Value Date   HGBA1C 4.8 08/12/2018   Lab Results  Component Value Date   CHOL 227 (H) 07/24/2023   HDL 77 07/24/2023   LDLCALC 120 (H) 07/24/2023   TRIG 178 (H) 07/24/2023   CHOLHDL 2.9 07/24/2023    Significant Diagnostic Results in last 30 days:  No results found.  Assessment/Plan  Obesity BMI 33.95 with associated comorbidity He is on Zepbound  for weight management, experiencing mild constipation managed with fiber. Weight has decreased from 265 lbs to 243.4 lbs. He consumes about half of his previous intake and feels satiated sooner. He maintains a walking routine of 8,000 to 10,000 steps daily. Weight loss is anticipated to improve cholesterol levels. Medication is obtained via [employer],  paid through his health savings account. - Continue Zepbound  therapy - Monitor for side effects - Maintain current walking routine - Order routine blood work - Schedule follow-up in six months  Hypertension Blood pressure has decreased from 160/92 mmHg, attributed to weight loss and lifestyle modifications. - Monitor blood pressure - Encourage continued weight loss and healthy lifestyle  Sleep Apnea He uses a CPAP machine for sleep apnea, which he has used for nearly 20 years. Weight loss may improve his condition. - Continue CPAP use - Monitor for improvements with weight loss   Family/ staff Communication: Reviewed plan of care with patient verbalized understanding  Labs/tests ordered:  - CBC with Differential/Platelet - CMP with eGFR([facility]) - TSH - Lipid panel  Next Appointment: Return in about 6 months (around 02/26/2024) for medical mangement of chronic issues., Fasting labs in 6 months prior to visit.   Total time:20  minutes. Greater than 50% of total time spent doing patient education regarding morbid obesity, essential hypertension, obstructive sleep apnea on CPAP and health maintenance including symptom/medication management.    Estil Heman, NP

## 2023-09-26 DIAGNOSIS — G4733 Obstructive sleep apnea (adult) (pediatric): Secondary | ICD-10-CM

## 2023-09-27 ENCOUNTER — Other Ambulatory Visit: Payer: Self-pay | Admitting: Nurse Practitioner

## 2023-09-27 MED ORDER — ZEPBOUND 15 MG/0.5ML ~~LOC~~ SOAJ: 15.0000 mg | SUBCUTANEOUS | 5 refills | Status: DC

## 2023-09-27 NOTE — Addendum Note (Signed)
 Addended by: LYNNANN COUGH A on: 09/27/2023 11:55 AM   Modules accepted: Orders

## 2023-09-27 NOTE — Telephone Encounter (Signed)
 Mast, Man X, NP to Me (Selected Message)     09/27/23  9:15 AM Ok to send Zepbound  15mg  Thank you        Rx sent to pharmacy as requested medication list updated.

## 2023-09-28 ENCOUNTER — Other Ambulatory Visit: Payer: Self-pay | Admitting: Adult Health

## 2023-09-28 MED ORDER — TIRZEPATIDE-WEIGHT MANAGEMENT 10 MG/0.5ML ~~LOC~~ SOLN
15.0000 mg | SUBCUTANEOUS | 1 refills | Status: DC
Start: 2023-09-28 — End: 2023-10-04

## 2023-09-28 NOTE — Telephone Encounter (Signed)
 Sent eRx  for Zepbound  vial.

## 2023-10-04 ENCOUNTER — Telehealth: Payer: Self-pay

## 2023-10-04 MED ORDER — TIRZEPATIDE-WEIGHT MANAGEMENT 10 MG/0.5ML ~~LOC~~ SOLN: 10.0000 mg | SUBCUTANEOUS | 1 refills | Status: DC

## 2023-10-04 NOTE — Telephone Encounter (Signed)
 Updated Rx to 10 mg and sent

## 2023-10-04 NOTE — Telephone Encounter (Signed)
 Noted

## 2023-10-04 NOTE — Telephone Encounter (Signed)
 Incoming fax received from [employer] asking that we call [phone].  I called pharmacy and they stated the rx for Zepbound  is incorrect and the instructions need to be updated to inject 10 mg into skin per week versus 15 mg, as the patient can not inject more that the dosage selected.   Monina rx'ed medication for patient and I will send to Harlene who is covering for her

## 2023-10-26 ENCOUNTER — Other Ambulatory Visit: Payer: Self-pay | Admitting: Family

## 2023-10-26 MED ORDER — TIRZEPATIDE 12.5 MG/0.5ML ~~LOC~~ SOAJ: 12.5000 mg | SUBCUTANEOUS | 3 refills | Status: DC

## 2023-10-29 ENCOUNTER — Other Ambulatory Visit: Payer: Self-pay | Admitting: Family

## 2023-10-29 DIAGNOSIS — G4733 Obstructive sleep apnea (adult) (pediatric): Secondary | ICD-10-CM

## 2023-10-29 MED ORDER — TIRZEPATIDE-WEIGHT MANAGEMENT 12.5 MG/0.5ML ~~LOC~~ SOLN
12.5000 mg | SUBCUTANEOUS | 3 refills | Status: DC
Start: 2023-10-29 — End: 2023-10-30

## 2023-10-29 NOTE — Telephone Encounter (Signed)
Requested medication is not on active medication list  

## 2024-01-18 ENCOUNTER — Other Ambulatory Visit: Payer: Self-pay | Admitting: Family

## 2024-01-28 DIAGNOSIS — G4733 Obstructive sleep apnea (adult) (pediatric): Secondary | ICD-10-CM

## 2024-01-28 NOTE — Telephone Encounter (Signed)
Pended Rx and sent to [facility] for approval.  ?

## 2024-01-29 MED ORDER — ZEPBOUND 12.5 MG/0.5ML ~~LOC~~ SOLN: 0.5000 mL | SUBCUTANEOUS | 3 refills | Status: DC

## 2024-02-05 ENCOUNTER — Other Ambulatory Visit: Payer: Self-pay | Admitting: Family

## 2024-02-05 DIAGNOSIS — G4733 Obstructive sleep apnea (adult) (pediatric): Secondary | ICD-10-CM

## 2024-02-06 NOTE — Telephone Encounter (Signed)
 Pharmacy comment: script was written correctly for vials, but the note says ask for pen injector. we do not supply the pen injector-only vials. please clarify if vials are ok to dispense.

## 2024-02-06 NOTE — Telephone Encounter (Signed)
 Okay to substitute for vials

## 2024-02-11 MED ORDER — ZEPBOUND 12.5 MG/0.5ML ~~LOC~~ SOLN: 0.5000 mL | SUBCUTANEOUS | 3 refills | Status: AC

## 2024-02-11 NOTE — Telephone Encounter (Signed)
 New Rx sent to pharmacy

## 2024-02-11 NOTE — Telephone Encounter (Signed)
 Copied from CRM #[id number]. Topic: Clinical - Prescription Issue >> Feb 11, 2024  4:00 PM Fredrica W wrote: Reason for CRM: Vernell from [employer] called about ZEPBOUND  12.5 MG/0.5ML SOLN. States Rx invalid - sent over for auto injectors - only sell vials at pharmacy. Need to know if vials are ok or if patient wants to fill at another pharmacy. Thank You   Phone: [phone] 7am-8:30pm

## 2024-02-21 ENCOUNTER — Other Ambulatory Visit: Payer: Self-pay | Admitting: Family

## 2024-02-22 ENCOUNTER — Other Ambulatory Visit: Payer: Self-pay

## 2024-02-22 DIAGNOSIS — E782 Mixed hyperlipidemia: Secondary | ICD-10-CM

## 2024-02-22 DIAGNOSIS — I10 Essential (primary) hypertension: Secondary | ICD-10-CM

## 2024-02-25 ENCOUNTER — Ambulatory Visit: Payer: Self-pay | Admitting: Family

## 2024-02-25 ENCOUNTER — Encounter: Payer: Self-pay | Admitting: Family

## 2024-02-25 VITALS — BP 140/80 | HR 77 | Temp 98.1°F | Resp 20 | Ht 71.0 in | Wt 216.8 lb

## 2024-02-25 DIAGNOSIS — G4733 Obstructive sleep apnea (adult) (pediatric): Secondary | ICD-10-CM | POA: Diagnosis not present

## 2024-02-25 DIAGNOSIS — E782 Mixed hyperlipidemia: Secondary | ICD-10-CM | POA: Diagnosis not present

## 2024-02-25 DIAGNOSIS — I10 Essential (primary) hypertension: Secondary | ICD-10-CM

## 2024-02-25 DIAGNOSIS — R17 Unspecified jaundice: Secondary | ICD-10-CM

## 2024-02-25 DIAGNOSIS — Z5320 Procedure and treatment not carried out because of patient's decision for unspecified reasons: Secondary | ICD-10-CM | POA: Insufficient documentation

## 2024-02-25 DIAGNOSIS — D509 Iron deficiency anemia, unspecified: Secondary | ICD-10-CM

## 2024-02-25 MED ORDER — HYDROCHLOROTHIAZIDE 50 MG PO TABS: 50.0000 mg | ORAL_TABLET | Freq: Every day | ORAL | 1 refills | Status: AC

## 2024-02-25 MED ORDER — IRON 325 (65 FE) MG PO TABS: 325.0000 mg | ORAL_TABLET | Freq: Two times a day (BID) | ORAL | Status: AC

## 2024-02-25 NOTE — Progress Notes (Signed)
 Provider: Roxan Plough FNP-C   [name], [name], NP  Patient Care Team: Estelene Carmack, [name], NP as PCP - General (Family Medicine)  Extended Emergency Contact Information Primary Emergency Contact: Dorer,Peggy Address: [address]          [place], [place] [address] [place] Mobile Phone: [phone] Relation: Spouse Secondary Emergency Contact: [facility] Phone: [phone] Relation: Friend Preferred language: Isadora Interpreter needed? No  Code Status:  Full Code  Goals of care: Advanced Directive information    02/25/2024    2:48 PM  Advanced Directives  Does Patient Have a Medical Advance Directive? No  Would patient like information on creating a medical advance directive? No - Patient declined     Chief Complaint  Patient presents with   Medical Management of Chronic Issues    6 Month follow up.    HPI:  Pt is a 68 y.o. male seen today for medical management of chronic diseases. He denies any acute issues this visit.  Recent lab results reviewed and discussed during visit.   Hypertension - on amlodipine  and Hydrochlorothiazide .denies any headache,dizziness,vision changes,fatigue,chest tightness,palpitation,chest pain or shortness of breath.     Obstructive sleep Apnea -wears a CPAP at night.  Currently on Zepbound  12.5 mg for weight management.  Weight down to 216.8 pounds from 243 pounds on last visit 6 months ago.  Tolerating Zepbound  well without any side effects.  Hyperlipidemia -TC 173 previous 227,TRG 63 previous was 178,LDL 97 previous was 120.  Has made dietary changes and losing weight on Zepbound  has helped.  He does exercise by weightlifting 3 times a week rowing and walking 3 times per week for 10,000 steps each day.  Iron  deficiency anemia -recent hemoglobin down to 12.5 with low indices previous was 13.1  and 12.6.  Has had low hemoglobin for several years since he was young. He has not been seen by hematologist.  He  declined any black stool or blood in the stool.  He has declined colonoscopy and Cologuard.  Elevated serum bilirubin - recent bilirubin higher than previous 2.9 previous was 2.3 and 1.5 he denies any right upper quadrant pain,nausea /vomiting or shoulder/back pain.   Past Medical History:  Diagnosis Date   Hypertension    Sleep apnea    Past Surgical History:  Procedure Laterality Date   APPENDECTOMY     arm fracture     repair right arm/wrist    Allergies[1]  Allergies as of 02/25/2024       Reactions   Penicillins    Has patient had a PCN reaction causing immediate rash, facial/tongue/throat swelling, SOB or lightheadedness with hypotension: Y Has patient had a PCN reaction causing severe rash involving mucus membranes or skin necrosis: Y Has patient had a PCN reaction that required hospitalization: N Has patient had a PCN reaction occurring within the last 10 years: N If all of the above answers are NO, then may proceed with Cephalosporin use.   Shellfish Allergy         Medication List        Accurate as of February 25, 2024  3:07 PM. If you have any questions, ask your nurse or doctor.          amLODipine  10 MG tablet Commonly known as: NORVASC  TAKE 1 TABLET BY MOUTH EVERY DAY   CO Q-10 PO Take 1 tablet by mouth daily.   Fish Oil 1000 MG Caps Take 1 capsule by mouth daily.   hydrochlorothiazide  50  MG tablet Commonly known as: HYDRODIURIL  TAKE 1 TABLET BY MOUTH EVERY DAY   IRON  PO Take 1 tablet by mouth daily.   KRILL OIL PO Take by mouth.   TURMERIC PO Take 1 capsule by mouth daily.   VITAMIN B COMPLEX PO Take 1 tablet by mouth daily.   Vitamin D3 125 MCG (5000 UT) Caps Take 1 capsule by mouth daily.   Zepbound  12.5 MG/0.5ML Soln Generic drug: Tirzepatide -Weight Management Inject 0.5 mLs into the skin once a week.   Zinc 50 MG Tabs Take by mouth daily.        Review of Systems  Constitutional:  Negative for appetite change,  chills, fatigue, fever and unexpected weight change.  HENT:  Negative for congestion, dental problem, ear discharge, ear pain, facial swelling, hearing loss, nosebleeds, postnasal drip, rhinorrhea, sinus pressure, sinus pain, sneezing, sore throat, tinnitus and trouble swallowing.   Eyes:  Negative for pain, discharge, redness, itching and visual disturbance.  Respiratory:  Negative for cough, chest tightness, shortness of breath and wheezing.   Cardiovascular:  Negative for chest pain, palpitations and leg swelling.  Gastrointestinal:  Negative for abdominal distention, abdominal pain, blood in stool, constipation, diarrhea, nausea and vomiting.  Endocrine: Negative for cold intolerance, heat intolerance, polydipsia, polyphagia and polyuria.  Genitourinary:  Negative for difficulty urinating, dysuria, flank pain, frequency and urgency.  Musculoskeletal:  Negative for arthralgias, back pain, gait problem, joint swelling, myalgias, neck pain and neck stiffness.  Skin:  Negative for color change, pallor, rash and wound.  Neurological:  Negative for dizziness, syncope, speech difficulty, weakness, light-headedness, numbness and headaches.  Hematological:  Does not bruise/bleed easily.  Psychiatric/Behavioral:  Negative for agitation, behavioral problems, confusion, hallucinations, self-injury, sleep disturbance and suicidal ideas. The patient is not nervous/anxious.     Immunization History  Administered Date(s) Administered   Influenza,inj,Quad PF,6+ Mos 11/12/2018   PFIZER(Purple Top)SARS-COV-2 Vaccination 05/19/2019, 06/18/2019   PNEUMOCOCCAL CONJUGATE-20 08/27/2023   Tdap 10/22/2016   Zoster Recombinant(Shingrix) 01/11/2024   Pertinent  Health Maintenance Due  Topic Date Due   Influenza Vaccine  06/10/2024 (Originally 10/12/2023)   Colonoscopy  07/23/2024 (Originally 08/03/2002)      09/14/2017    8:17 AM 08/12/2018    8:18 AM 11/12/2018    3:29 PM 07/24/2023    1:49 PM 02/25/2024    2:48 PM   Fall Risk  Falls in the past year? No  0  0  0 0  Was there an injury with Fall?  0  0  0  0  Fall Risk Category Calculator   0 0 0  Fall Risk Category (Retired)   Low     (RETIRED) Patient Fall Risk Level  Low fall risk      Patient at Risk for Falls Due to    No Fall Risks No Fall Risks  Fall risk Follow up    Falls prevention discussed;Falls evaluation completed Falls evaluation completed     Data saved with a previous flowsheet row definition   Functional Status Survey:    Vitals:   02/25/24 1451  BP: (!) 140/80  Pulse: 77  Resp: 20  Temp: 98.1 F (36.7 C)  SpO2: 98%  Weight: 216 lb 12.8 oz (98.3 kg)  Height: 5' 11 (1.803 m)   Body mass index is 30.24 kg/m. Physical Exam Vitals reviewed.  Constitutional:      General: He is not in acute distress.    Appearance: Normal appearance. He is obese. He is not ill-appearing  or diaphoretic.  HENT:     Head: Normocephalic.     Right Ear: Tympanic membrane, ear canal and external ear normal. There is no impacted cerumen.     Left Ear: Tympanic membrane, ear canal and external ear normal. There is no impacted cerumen.     Nose: Nose normal. No congestion or rhinorrhea.     Mouth/Throat:     Mouth: Mucous membranes are moist.     Pharynx: Oropharynx is clear. No oropharyngeal exudate or posterior oropharyngeal erythema.  Eyes:     General: No scleral icterus.       Right eye: No discharge.        Left eye: No discharge.     Extraocular Movements: Extraocular movements intact.     Conjunctiva/sclera: Conjunctivae normal.     Pupils: Pupils are equal, round, and reactive to light.  Neck:     Vascular: No carotid bruit.  Cardiovascular:     Rate and Rhythm: Normal rate and regular rhythm.     Pulses: Normal pulses.     Heart sounds: Normal heart sounds. No murmur heard.    No friction rub. No gallop.  Pulmonary:     Effort: Pulmonary effort is normal. No respiratory distress.     Breath sounds: Normal breath sounds. No  wheezing, rhonchi or rales.  Chest:     Chest wall: No tenderness.  Abdominal:     General: Bowel sounds are normal. There is no distension.     Palpations: Abdomen is soft. There is no mass.     Tenderness: There is no abdominal tenderness. There is no right CVA tenderness, left CVA tenderness, guarding or rebound.  Musculoskeletal:        General: No swelling or tenderness. Normal range of motion.     Cervical back: Normal range of motion. No rigidity or tenderness.     Right lower leg: No edema.     Left lower leg: No edema.  Lymphadenopathy:     Cervical: No cervical adenopathy.  Skin:    General: Skin is warm and dry.     Coloration: Skin is not pale.     Findings: No bruising, erythema, lesion or rash.  Neurological:     Mental Status: He is alert and oriented to person, place, and time.     Cranial Nerves: No cranial nerve deficit.     Sensory: No sensory deficit.     Motor: No weakness.     Coordination: Coordination normal.     Gait: Gait normal.  Psychiatric:        Mood and Affect: Mood normal.        Speech: Speech normal.        Behavior: Behavior normal.        Thought Content: Thought content normal.        Judgment: Judgment normal.    Labs reviewed: Recent Labs    07/24/23 1504 02/22/24 0814  NA 140 139  K 4.1 4.4  CL 99 102  CO2 30 31  GLUCOSE 99 88  BUN 13 19  CREATININE 0.95 0.91  CALCIUM 9.8 9.9   Recent Labs    07/24/23 1504 02/22/24 0814  AST 56* 15  ALT 83* 14  BILITOT 2.3* 2.9*  PROT 7.4 7.0   Recent Labs    07/24/23 1504 02/22/24 0814  WBC 7.2 7.3  NEUTROABS 4,529 3,811  HGB 13.1* 12.5*  HCT 42.1 41.1  MCV 64.9* 65.0*  PLT 249 273  Lab Results  Component Value Date   TSH 2.20 02/22/2024   Lab Results  Component Value Date   HGBA1C 4.8 08/12/2018   Lab Results  Component Value Date   CHOL 173 02/22/2024   HDL 61 02/22/2024   LDLCALC 97 02/22/2024   TRIG 63 02/22/2024   CHOLHDL 2.8 02/22/2024    Significant  Diagnostic Results in last 30 days:  No results found.  Assessment/Plan  1. Essential hypertension (Primary) - Blood pressure stable -Continue on amlodipine  and hydrochlorothiazide  -Continue dietary modification and exercise - TSH; Future - CBC with Differential/Platelet; Future - CMP; Future  2. Mixed hyperlipidemia LDL at goal -Continue dietary modification and exercise at least 30 minutes 3 times per week  - Lipid panel; Future  3. Obstructive sleep apnea -Continue CPAP at bedtime -Continue with lifestyle modification -Continue Zepbound  12.5 mg injection weekly  - CBC with Differential/Platelet; Future - CMP; Future  4. Morbid obesity (HCC) - Body mass index is 30.24 kg/m with associated comorbidity hypertension and obstructive sleep apnea - Continue with dietary modification and exercise as above  5. Iron  deficiency anemia, unspecified iron  deficiency anemia type Hemoglobin 12.5 previous 13.1 no signs of bleeding reported.will add TIBC and Ferritin panel to current lab work to rule out iron  deficiency anemia.   6. Colon cancer screening declined Colonoscopy and Cologuard discussed but declined.  Family/ staff Communication: Reviewed plan of care with patient verbalized understanding  Labs/tests ordered:  - CBC with Differential/Platelet - CMP with eGFR([facility]) - TSH - Lipid panel  Next Appointment : Return in about 6 months (around 08/25/2024) for medical mangement of chronic issues., Fasting labs in 6 months prior to visit.  Spent 30 minutes of Face to face and non-face to face with patient  >50% time spent counseling; reviewing medical record; tests; labs; documentation and developing future plan of care.   [name] Gavin Rone, NP      [1]  Allergies Allergen Reactions   Penicillins     Has patient had a PCN reaction causing immediate rash, facial/tongue/throat swelling, SOB or lightheadedness with hypotension: Y Has patient had a PCN reaction causing severe  rash involving mucus membranes or skin necrosis: Y Has patient had a PCN reaction that required hospitalization: N Has patient had a PCN reaction occurring within the last 10 years: N If all of the above answers are NO, then may proceed with Cephalosporin use.    Shellfish Allergy

## 2024-02-26 LAB — COMPLETE METABOLIC PANEL WITHOUT GFR
AG Ratio: 2 (calc) (ref 1.0–2.5)
ALT: 14 U/L (ref 9–46)
AST: 15 U/L (ref 10–35)
Albumin: 4.7 g/dL (ref 3.6–5.1)
Alkaline phosphatase (APISO): 38 U/L (ref 35–144)
BUN: 19 mg/dL (ref 7–25)
CO2: 31 mmol/L (ref 20–32)
Calcium: 9.9 mg/dL (ref 8.6–10.3)
Chloride: 102 mmol/L (ref 98–110)
Creat: 0.91 mg/dL (ref 0.70–1.35)
Globulin: 2.3 g/dL (ref 1.9–3.7)
Glucose, Bld: 88 mg/dL (ref 65–99)
Potassium: 4.4 mmol/L (ref 3.5–5.3)
Sodium: 139 mmol/L (ref 135–146)
Total Bilirubin: 2.9 mg/dL — ABNORMAL HIGH (ref 0.2–1.2)
Total Protein: 7 g/dL (ref 6.1–8.1)

## 2024-02-26 LAB — IRON,TIBC AND FERRITIN PANEL
%SAT: 25 % (ref 20–48)
Ferritin: 173 ng/mL (ref 24–380)
Iron: 89 ug/dL (ref 50–180)
TIBC: 359 ug/dL (ref 250–425)

## 2024-02-26 LAB — LIPID PANEL
Cholesterol: 173 mg/dL (ref <200)
HDL: 61 mg/dL (ref >=40)
LDL Cholesterol (Calc): 97 mg/dL
Non-HDL Cholesterol (Calc): 112 mg/dL (ref <130)
Total CHOL/HDL Ratio: 2.8 (calc) (ref <5.0)
Triglycerides: 63 mg/dL (ref <150)

## 2024-02-26 LAB — CBC WITH DIFFERENTIAL/PLATELET
Absolute Lymphocytes: 2438 {cells}/uL (ref 850–3900)
Absolute Monocytes: 854 {cells}/uL (ref 200–950)
Basophils Absolute: 58 {cells}/uL (ref 0–200)
Basophils Relative: 0.8 %
Eosinophils Absolute: 139 {cells}/uL (ref 15–500)
Eosinophils Relative: 1.9 %
HCT: 41.1 % (ref 39.4–51.1)
Hemoglobin: 12.5 g/dL — ABNORMAL LOW (ref 13.2–17.1)
MCH: 19.8 pg — ABNORMAL LOW (ref 27.0–33.0)
MCHC: 30.4 g/dL — ABNORMAL LOW (ref 31.6–35.4)
MCV: 65 fL — ABNORMAL LOW (ref 81.4–101.7)
MPV: 11 fL (ref 7.5–12.5)
Monocytes Relative: 11.7 %
Neutro Abs: 3811 {cells}/uL (ref 1500–7800)
Neutrophils Relative %: 52.2 %
Platelets: 273 Thousand/uL (ref 140–400)
RBC: 6.32 Million/uL — ABNORMAL HIGH (ref 4.20–5.80)
RDW: 18.3 % — ABNORMAL HIGH (ref 11.0–15.0)
Total Lymphocyte: 33.4 %
WBC: 7.3 Thousand/uL (ref 3.8–10.8)

## 2024-02-26 LAB — TEST AUTHORIZATION

## 2024-02-26 LAB — TSH: TSH: 2.2 m[IU]/L (ref 0.40–4.50)

## 2024-02-28 ENCOUNTER — Other Ambulatory Visit: Payer: Self-pay | Admitting: Family

## 2024-02-28 DIAGNOSIS — R17 Unspecified jaundice: Secondary | ICD-10-CM

## 2024-08-22 ENCOUNTER — Other Ambulatory Visit

## 2024-08-25 ENCOUNTER — Ambulatory Visit: Admitting: Family
